# Patient Record
Sex: Female | Born: 1940 | Race: White | Hispanic: No | Smoking: Former smoker
Health system: Southern US, Community
[De-identification: ages and names within clinical notes are randomized; demographics above are authoritative.]

## PROBLEM LIST (undated history)

## (undated) DIAGNOSIS — M1990 Unspecified osteoarthritis, unspecified site: Secondary | ICD-10-CM

## (undated) DIAGNOSIS — H8110 Benign paroxysmal vertigo, unspecified ear: Secondary | ICD-10-CM

## (undated) DIAGNOSIS — E785 Hyperlipidemia, unspecified: Secondary | ICD-10-CM

## (undated) DIAGNOSIS — R002 Palpitations: Secondary | ICD-10-CM

## (undated) DIAGNOSIS — H409 Unspecified glaucoma: Secondary | ICD-10-CM

## (undated) DIAGNOSIS — B019 Varicella without complication: Secondary | ICD-10-CM

## (undated) HISTORY — DX: Varicella without complication: B01.9

## (undated) HISTORY — DX: Palpitations: R00.2

## (undated) HISTORY — PX: JOINT REPLACEMENT: SHX530

## (undated) HISTORY — DX: Unspecified osteoarthritis, unspecified site: M19.90

## (undated) HISTORY — DX: Hyperlipidemia, unspecified: E78.5

## (undated) HISTORY — DX: Unspecified glaucoma: H40.9

## (undated) HISTORY — PX: PLACEMENT OF BREAST IMPLANTS: SHX6334

## (undated) HISTORY — DX: Benign paroxysmal vertigo, unspecified ear: H81.10

## (undated) HISTORY — PX: AUGMENTATION MAMMAPLASTY: SUR837

## (undated) HISTORY — PX: COSMETIC SURGERY: SHX468

## (undated) HISTORY — PX: BREAST IMPLANT REMOVAL: SUR1101

## (undated) HISTORY — PX: CATARACT EXTRACTION: SUR2

---

## 2004-07-26 HISTORY — PX: TOTAL HIP ARTHROPLASTY: SHX124

## 2005-02-23 HISTORY — PX: TOTAL HIP ARTHROPLASTY: SHX124

## 2016-02-15 DIAGNOSIS — H43813 Vitreous degeneration, bilateral: Secondary | ICD-10-CM | POA: Diagnosis not present

## 2016-02-15 DIAGNOSIS — H402232 Chronic angle-closure glaucoma, bilateral, moderate stage: Secondary | ICD-10-CM | POA: Diagnosis not present

## 2016-02-15 DIAGNOSIS — H2513 Age-related nuclear cataract, bilateral: Secondary | ICD-10-CM | POA: Diagnosis not present

## 2016-04-17 DIAGNOSIS — J301 Allergic rhinitis due to pollen: Secondary | ICD-10-CM | POA: Diagnosis not present

## 2016-04-17 DIAGNOSIS — R05 Cough: Secondary | ICD-10-CM | POA: Diagnosis not present

## 2016-05-22 DIAGNOSIS — R0982 Postnasal drip: Secondary | ICD-10-CM | POA: Diagnosis not present

## 2016-06-07 DIAGNOSIS — H402232 Chronic angle-closure glaucoma, bilateral, moderate stage: Secondary | ICD-10-CM | POA: Diagnosis not present

## 2016-06-14 DIAGNOSIS — Z1231 Encounter for screening mammogram for malignant neoplasm of breast: Secondary | ICD-10-CM | POA: Diagnosis not present

## 2016-06-26 DIAGNOSIS — N63 Unspecified lump in breast: Secondary | ICD-10-CM | POA: Diagnosis not present

## 2016-06-26 DIAGNOSIS — R928 Other abnormal and inconclusive findings on diagnostic imaging of breast: Secondary | ICD-10-CM | POA: Diagnosis not present

## 2016-06-26 DIAGNOSIS — R922 Inconclusive mammogram: Secondary | ICD-10-CM | POA: Diagnosis not present

## 2016-07-01 DIAGNOSIS — N959 Unspecified menopausal and perimenopausal disorder: Secondary | ICD-10-CM | POA: Diagnosis not present

## 2016-07-01 DIAGNOSIS — Z0000 Encounter for general adult medical examination without abnormal findings: Secondary | ICD-10-CM | POA: Diagnosis not present

## 2016-07-22 DIAGNOSIS — H2513 Age-related nuclear cataract, bilateral: Secondary | ICD-10-CM | POA: Diagnosis not present

## 2016-07-22 DIAGNOSIS — H402232 Chronic angle-closure glaucoma, bilateral, moderate stage: Secondary | ICD-10-CM | POA: Diagnosis not present

## 2016-08-13 DIAGNOSIS — H269 Unspecified cataract: Secondary | ICD-10-CM | POA: Diagnosis not present

## 2016-08-13 DIAGNOSIS — H2511 Age-related nuclear cataract, right eye: Secondary | ICD-10-CM | POA: Diagnosis not present

## 2016-08-13 DIAGNOSIS — Z87891 Personal history of nicotine dependence: Secondary | ICD-10-CM | POA: Diagnosis not present

## 2016-09-02 DIAGNOSIS — H2512 Age-related nuclear cataract, left eye: Secondary | ICD-10-CM | POA: Diagnosis not present

## 2016-09-10 DIAGNOSIS — H269 Unspecified cataract: Secondary | ICD-10-CM | POA: Diagnosis not present

## 2016-09-10 DIAGNOSIS — H2512 Age-related nuclear cataract, left eye: Secondary | ICD-10-CM | POA: Diagnosis not present

## 2016-09-24 DIAGNOSIS — Z23 Encounter for immunization: Secondary | ICD-10-CM | POA: Diagnosis not present

## 2016-11-04 DIAGNOSIS — H26491 Other secondary cataract, right eye: Secondary | ICD-10-CM | POA: Diagnosis not present

## 2016-11-25 HISTORY — PX: TRIGGER FINGER RELEASE: SHX641

## 2016-12-18 DIAGNOSIS — M65312 Trigger thumb, left thumb: Secondary | ICD-10-CM | POA: Diagnosis not present

## 2016-12-30 DIAGNOSIS — M65312 Trigger thumb, left thumb: Secondary | ICD-10-CM | POA: Diagnosis not present

## 2017-02-12 DIAGNOSIS — H43813 Vitreous degeneration, bilateral: Secondary | ICD-10-CM | POA: Diagnosis not present

## 2017-02-12 DIAGNOSIS — H402232 Chronic angle-closure glaucoma, bilateral, moderate stage: Secondary | ICD-10-CM | POA: Diagnosis not present

## 2017-02-19 DIAGNOSIS — R928 Other abnormal and inconclusive findings on diagnostic imaging of breast: Secondary | ICD-10-CM | POA: Diagnosis not present

## 2017-02-19 DIAGNOSIS — R922 Inconclusive mammogram: Secondary | ICD-10-CM | POA: Diagnosis not present

## 2017-04-30 DIAGNOSIS — H402232 Chronic angle-closure glaucoma, bilateral, moderate stage: Secondary | ICD-10-CM | POA: Diagnosis not present

## 2017-08-11 ENCOUNTER — Encounter: Payer: Self-pay | Admitting: Primary Care

## 2017-08-11 ENCOUNTER — Ambulatory Visit (INDEPENDENT_AMBULATORY_CARE_PROVIDER_SITE_OTHER): Payer: Medicare Other | Admitting: Primary Care

## 2017-08-11 VITALS — BP 124/74 | HR 78 | Temp 98.1°F | Ht 62.0 in | Wt 124.1 lb

## 2017-08-11 DIAGNOSIS — E785 Hyperlipidemia, unspecified: Secondary | ICD-10-CM

## 2017-08-11 DIAGNOSIS — Z23 Encounter for immunization: Secondary | ICD-10-CM | POA: Diagnosis not present

## 2017-08-11 DIAGNOSIS — H409 Unspecified glaucoma: Secondary | ICD-10-CM | POA: Insufficient documentation

## 2017-08-11 DIAGNOSIS — M1990 Unspecified osteoarthritis, unspecified site: Secondary | ICD-10-CM | POA: Insufficient documentation

## 2017-08-11 DIAGNOSIS — M159 Polyosteoarthritis, unspecified: Secondary | ICD-10-CM | POA: Diagnosis not present

## 2017-08-11 NOTE — Assessment & Plan Note (Signed)
Located to hands mostly, also with symptoms of likely carpal tunnel. Will have her continue to wear compression gloves and braces as she's had improvement. Continue to monitor.

## 2017-08-11 NOTE — Assessment & Plan Note (Signed)
Diagnosed years ago, will be seeing new ophthalmologist next week. Continue latanoprost.

## 2017-08-11 NOTE — Progress Notes (Signed)
   Subjective:    Patient ID: Ann Estes, female    DOB: 12/26/1940, 76 y.o.   MRN: [id number]  HPI  [name] is a 76 year old female who presents today to establish care and discuss the problems mentioned below. Will obtain old records. She underwent a CPE in early 2018.  1) Arthritis: Complaints of hand stiffness, intermittent pain, and tingling to fingers that began about 8 months to 12 months ago. She would wake up at night with pain to her palm and forearm, also with symptoms of morning hand stiffness. Over the last several months she's been wearing compression gloves and arm braces at night and has noticed significant improvement.   She does endorse a history of temporary neck pain and left shoulder pain (different times) that resolved after she made adjustments in her ADL's.  History of trigger finger to her left thumb with surgical intervention.  2) Glaucoma: Diagnosed years ago. Currently managed on latanoprost 0.005% eye drops. New patient appointment scheduled for next week.   3) Hyperlipidemia: History of high and low readings in the past. Was recommended to start a statin drug in the past but refused.   Review of Systems  Respiratory: Negative for shortness of breath.   Cardiovascular: Negative for chest pain.  Musculoskeletal: Positive for arthralgias. Negative for neck pain and neck stiffness.  Neurological: Negative for dizziness and headaches.       Past Medical History:  Diagnosis Date  . Glaucoma   . Hyperlipidemia   . Osteoarthritis      Social History   Social History  . Marital status: Divorced    Spouse name: N/A  . Number of children: N/A  . Years of education: N/A   Occupational History  . Not on file.   Social History Main Topics  . Smoking status: Former Research scientist (life sciences)  . Smokeless tobacco: Never Used  . Alcohol use Yes  . Drug use: Unknown  . Sexual activity: Not on file   Other Topics Concern  . Not on file   Social History Narrative  . No  narrative on file    Past Surgical History:  Procedure Laterality Date  . BREAST IMPLANT REMOVAL    . CATARACT EXTRACTION Bilateral   . COSMETIC SURGERY     Eyelids, tummy tuck  . PLACEMENT OF BREAST IMPLANTS    . 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 TOTAL HIP ARTHROPLASTY Left 02/2005  . TRIGGER FINGER RELEASE  11/2016   Thumb    No family history on file.  No Known Allergies  No current outpatient prescriptions on file prior to visit.   No current facility-administered medications on file prior to visit.     BP 124/74   Pulse 78   Temp 98.1 F (36.7 C) (Oral)   Ht 5\' 2"  (1.575 m)   Wt 124 lb 1.9 oz (56.3 kg)   SpO2 97%   BMI 22.70 kg/m    Objective:   Physical Exam  Constitutional: She appears well-nourished.  Neck: Neck supple.  Cardiovascular: Normal rate and regular rhythm.   Pulmonary/Chest: Effort normal and breath sounds normal.  Skin: Skin is warm and dry.  Psychiatric: She has a normal mood and affect.          Assessment & Plan:

## 2017-08-11 NOTE — Assessment & Plan Note (Signed)
Endorses intermittent history of, never managed on statin or any other treatment. Will obtain records for recent lipid panel, repeat in early 2019 during CPE.

## 2017-08-11 NOTE — Patient Instructions (Signed)
You were provided with a flu shot today.  I will obtain your records and notify you when you are due for your annual wellness visit.  Continue to wear the gloves and braces as discussed.  Follow up with the ophthalmologist as scheduled.  It was a pleasure to meet you today! Please don't hesitate to call me with any questions. Welcome to [facility]!   Carpal Tunnel Syndrome Carpal tunnel syndrome is a condition that causes pain in your hand and arm. The carpal tunnel is a narrow area located on the palm side of your wrist. Repeated wrist motion or certain diseases may cause swelling within the tunnel. This swelling pinches the main nerve in the wrist (median nerve). What are the causes? This condition may be caused by:  Repeated wrist motions.  Wrist injuries.  Arthritis.  A cyst or tumor in the carpal tunnel.  Fluid buildup during pregnancy.  Sometimes the cause of this condition is not known. What increases the risk? This condition is more likely to develop in:  People who have jobs that cause them to repeatedly move their wrists in the same motion, such as Art gallery manager.  Women.  People with certain conditions, such as: ? Diabetes. ? Obesity. ? An underactive thyroid (hypothyroidism). ? Kidney failure.  What are the signs or symptoms? Symptoms of this condition include:  A tingling feeling in your fingers, especially in your thumb, index, and middle fingers.  Tingling or numbness in your hand.  An aching feeling in your entire arm, especially when your wrist and elbow are bent for long periods of time.  Wrist pain that goes up your arm to your shoulder.  Pain that goes down into your palm or fingers.  A weak feeling in your hands. You may have trouble grabbing and holding items.  Your symptoms may feel worse during the night. How is this diagnosed? This condition is diagnosed with a medical history and physical exam. You may also have tests,  including:  An electromyogram (EMG). This test measures electrical signals sent by your nerves into the muscles.  X-rays.  How is this treated? Treatment for this condition includes:  Lifestyle changes. It is important to stop doing or modify the activity that caused your condition.  Physical or occupational therapy.  Medicines for pain and inflammation. This may include medicine that is injected into your wrist.  A wrist splint.  Surgery.  Follow these instructions at home: If you have a splint:  Wear it as told by your health care provider. Remove it only as told by your health care provider.  Loosen the splint if your fingers become numb and tingle, or if they turn cold and blue.  Keep the splint clean and dry. General instructions  Take over-the-counter and prescription medicines only as told by your health care provider.  Rest your wrist from any activity that may be causing your pain. If your condition is work related, talk to your employer about changes that can be made, such as getting a wrist pad to use while typing.  If directed, apply ice to the painful area: ? Put ice in a plastic bag. ? Place a towel between your skin and the bag. ? Leave the ice on for 20 minutes, 2-3 times per day.  Keep all follow-up visits as told by your health care provider. This is important.  Do any exercises as told by your health care provider, physical therapist, or occupational therapist. Contact a health care provider if:  You have new symptoms.  Your pain is not controlled with medicines.  Your symptoms get worse. This information is not intended to replace advice given to you by your health care provider. Make sure you discuss any questions you have with your health care provider. Document Released: 11/08/2000 Document Revised: 03/21/2016 Document Reviewed: 03/29/2015 Elsevier Interactive Patient Education  2017 [employer].

## 2017-08-18 DIAGNOSIS — H401130 Primary open-angle glaucoma, bilateral, stage unspecified: Secondary | ICD-10-CM | POA: Diagnosis not present

## 2017-10-20 ENCOUNTER — Ambulatory Visit: Payer: Self-pay | Admitting: *Deleted

## 2017-10-20 NOTE — Telephone Encounter (Signed)
Spoke to patient by telephone and was advised that she has had this problem for years and has gone to her ENT in [place] for treatment for this in the past.   Patient stated that she was thinking about calling the [facility] to see if they can take care of this for her per the conversation with the triage nurse earlier. . Patient stated that if Anda Kraft does not do the procedure she would like her to refer her to an ENT that is more convenient for her. Advised patient that Anda Kraft is out of the office and will not hear back until after she returns tomorrow.

## 2017-10-20 NOTE — Telephone Encounter (Signed)
Please notify patient that I'm not proficient in performing Epley's manuevers but I'm happy to send her to ENT for treatment.   How often does she experience her vertigo symptoms? How often are they bad enough to seek treatment from ENT?

## 2017-10-20 NOTE — Telephone Encounter (Signed)
She has a diagnosis of BPPV and had an episode today. She would like the Epley maneuver performed on her by a physician. Reason for Disposition . [1] MODERATE dizziness (e.g., vertigo; feels very unsteady, interferes with normal activities) AND [2] has been evaluated by physician for this  Answer Assessment - Initial Assessment Questions 1. DESCRIPTION: "Describe your dizziness."     vertigo 2. VERTIGO: "Do you feel like either you or the room is spinning or tilting?"      yes 3. LIGHTHEADED: "Do you feel lightheaded?" (e.g., somewhat faint, woozy, weak upon standing)     yes 4. SEVERITY: "How bad is it?"  "Can you walk?"   - MILD - Feels unsteady but walking normally.   - MODERATE - Feels very unsteady when walking, but not falling; interferes with normal activities (e.g., school, work) .   - SEVERE - Unable to walk without falling (requires assistance).     severe 5. ONSET:  "When did the dizziness begin?"    today 6. AGGRAVATING FACTORS: "Does anything make it worse?" (e.g., standing, change in head position)     no 7. CAUSE: "What do you think is causing the dizziness?"    BPP 8. RECURRENT SYMPTOM: "Have you had dizziness before?" If so, ask: "When was the last time?" "What happened that time?"    Yes, dx of BPPH 9. OTHER SYMPTOMS: "Do you have any other symptoms?" (e.g., headache, weakness, numbness, vomiting, earache)     vomiting 10. PREGNANCY: "Is there any chance you are pregnant?" "When was your last menstrual period?"       no  Protocols used: DIZZINESS - VERTIGO-A-AH

## 2017-10-21 ENCOUNTER — Telehealth: Payer: Self-pay | Admitting: Primary Care

## 2017-10-21 ENCOUNTER — Other Ambulatory Visit: Payer: Self-pay | Admitting: Primary Care

## 2017-10-21 ENCOUNTER — Encounter: Payer: Self-pay | Admitting: Primary Care

## 2017-10-21 DIAGNOSIS — R42 Dizziness and giddiness: Secondary | ICD-10-CM

## 2017-10-21 NOTE — Telephone Encounter (Signed)
Spoken and notified patient of Kate's comments. Patient have actually knows how to do the Epley's maneuvers, she had done it 2 times since yesterday and it has improve.  She had vertigo symptoms once since May of this year but this last episode was really bad for patient. When it is this bad, she usually would seek help from the ENT.  Patient asked for referral to ENT for future treatment. She would like to go to [facility] and to ask for the doctor who is great at [facility] maneuvers.

## 2017-10-21 NOTE — Telephone Encounter (Signed)
Copied from [id number] [id number]. Topic: Quick Communication - Office Called Patient >> Oct 21, 2017  8:46 AM Cecelia Byars, NT wrote: Reason for CRM:  Patient returning call please call will be home all day

## 2017-10-21 NOTE — Telephone Encounter (Signed)
Message left for patient to return call.

## 2017-10-21 NOTE — Telephone Encounter (Signed)
Copied from [place] [id number]. Topic: Quick Communication - Office Called Patient >> Oct 21, 2017  8:39 AM Jacqualin Combes, CMA wrote: Reason for CRM: See Telephone encounter on 10/20/2017. Message left for patient to return call.  please call the pt back with info

## 2017-10-21 NOTE — Telephone Encounter (Signed)
Noted  Referral placed.

## 2017-10-21 NOTE — Telephone Encounter (Signed)
There is a telephone encounter on 10/20/2017 regarding this. This will be close. Please attached to other encounter.

## 2017-11-11 ENCOUNTER — Ambulatory Visit: Payer: No Typology Code available for payment source | Admitting: Internal Medicine

## 2017-11-12 DIAGNOSIS — H6121 Impacted cerumen, right ear: Secondary | ICD-10-CM | POA: Diagnosis not present

## 2017-11-12 DIAGNOSIS — H8111 Benign paroxysmal vertigo, right ear: Secondary | ICD-10-CM | POA: Diagnosis not present

## 2017-11-19 DIAGNOSIS — H8110 Benign paroxysmal vertigo, unspecified ear: Secondary | ICD-10-CM | POA: Diagnosis not present

## 2018-01-12 ENCOUNTER — Ambulatory Visit (INDEPENDENT_AMBULATORY_CARE_PROVIDER_SITE_OTHER): Payer: Medicare Other | Admitting: Primary Care

## 2018-01-12 ENCOUNTER — Encounter: Payer: Self-pay | Admitting: Primary Care

## 2018-01-12 VITALS — BP 120/76 | HR 80 | Temp 98.0°F | Ht 62.0 in | Wt 126.0 lb

## 2018-01-12 DIAGNOSIS — M159 Polyosteoarthritis, unspecified: Secondary | ICD-10-CM | POA: Diagnosis not present

## 2018-01-12 DIAGNOSIS — Z791 Long term (current) use of non-steroidal anti-inflammatories (NSAID): Secondary | ICD-10-CM

## 2018-01-12 DIAGNOSIS — M2560 Stiffness of unspecified joint, not elsewhere classified: Secondary | ICD-10-CM | POA: Diagnosis not present

## 2018-01-12 MED ORDER — MELOXICAM 15 MG PO TABS: ORAL_TABLET | ORAL | 0 refills | Status: DC

## 2018-01-12 NOTE — Patient Instructions (Addendum)
Stop by the lab prior to leaving today. I will notify you of your results once received.   Stop taking Naproxen/Aleve. Start taking meloxicam 15 mg tablets as needed for stiffness and pain. Take 1 tablet once daily.   You will be contacted regarding your referral to physical therapy.  Please let us know if you have not been contacted within one week.   Schedule an appointment with Dr. Lorelei Pont for further evaluation of your trigger finger.   Please schedule a Wellness Visit with our nurse and follow up with me anytime at your convenience. Make sure you come fasting for labs.   It was a pleasure to see you today!

## 2018-01-12 NOTE — Assessment & Plan Note (Signed)
Present to shoulders and hands/fingers. Check for RA given family history. Stop naproxen, start Meloxicam. BMP pending. Referral placed to physical therapy for further treatment. Will have her see sports medicine for further evaluation of trigger finger. Continue regular exercise to maintain mobility.

## 2018-01-12 NOTE — Progress Notes (Signed)
Subjective:    Patient ID: Ann Estes, female    DOB: [birth year], 77 y.o.   MRN: [id number]  HPI  Ann Estes is a 77 year old female with a history of osteoarthritis who presents today with a chief complaint of hand and shoulder stiffness.  She was last evaluated in August 2018 with complaints of hand stiffness and tingling to her fingers that began about one year prior. At that time she was using compression gloves and arm braces with significant improvement.   Since her last visit she continues to wake with stiffness in her fingers with decrease in range of motion. She'll then feel tingling to her fingers as she moves her fingers around. The tingling is intermittent throughout the day. She'll sometimes feel increased symptoms of tingling when on her computer, then sometimes at rest.   She's wearing her braces to bilateral hands most all of the time. She does also experience trigger finger daily to her right middle finger. If she doesn't wear her finger splint then she'll experience pain with contraction, she's been wearing her splint for the past one month. She's never undergone injections for trigger finger.   She does have chronic bilateral shoulder stiffness for which she notices most mornings when waking, improved throughout the day. She has a sister that has rheumatoid arthritis, she's never been tested. She's been taking Aleve 220 mg BID with improvement. She's not undergone physical therapy.  Review of Systems  Musculoskeletal: Positive for arthralgias. Negative for joint swelling.  Skin: Negative for color change.       Past Medical History:  Diagnosis Date  . BPPV (benign paroxysmal positional vertigo)   . Chicken pox   . Glaucoma   . Hyperlipidemia   . Osteoarthritis      Social History   Socioeconomic History  . Marital status: Divorced    Spouse name: Not on file  . Number of children: Not on file  . Years of education: Not on file  . Highest education level: Not  on file  Social Needs  . Financial resource strain: Not on file  . Food insecurity - worry: Not on file  . Food insecurity - inability: Not on file  . Transportation needs - medical: Not on file  . Transportation needs - non-medical: Not on file  Occupational History  . Not on file  Tobacco Use  . Smoking status: Former Research scientist (life sciences)  . Smokeless tobacco: Never Used  Substance and Sexual Activity  . Alcohol use: Yes  . Drug use: Not on file  . Sexual activity: Not on file  Other Topics Concern  . Not on file  Social History Narrative  . Not on file    Family History  Problem Relation Age of Onset  . Heart disease Mother   . Cancer Father   . Birth defects Daughter   . Arthritis Son   . Cancer Maternal Grandmother   . Heart attack Maternal Grandfather   . Asthma Sister   . Depression Sister   . Asthma Sister     No Known Allergies  Current Outpatient Medications on File Prior to Visit  Medication Sig Dispense Refill  . b complex vitamins capsule Take 1 capsule by mouth daily.    . Cholecalciferol (VITAMIN D3) 2000 units TABS Take by mouth.    . DENTAGEL 1.1 % GEL dental gel AFTER BRUSHING WITH TOOTHPAST,BRUSH WITH GEL FOR AT LEAST 1 MINUTE AT BEDTIME  3  . latanoprost (XALATAN) 0.005 % ophthalmic  solution INSTILL 1 DROP INTO BOTH EYES EVERY DAY  2  . Lysine 500 MG CAPS Take 500 mg by mouth daily.    . Magnesium 250 MG TABS Take 250 mg by mouth daily.    . naproxen sodium (ALEVE) 220 MG tablet Take 220 mg by mouth daily as needed.    . Probiotic Product (PROBIOTIC PO) Take by mouth.     No current facility-administered medications on file prior to visit.     BP 120/76   Pulse 80   Temp 98 F (36.7 C) (Oral)   Ht 5\' 2"  (1.575 m)   Wt 126 lb (57.2 kg)   SpO2 98%   BMI 23.05 kg/m    Objective:   Physical Exam  Constitutional: She appears well-nourished.  Neck: Neck supple.  Cardiovascular: Normal rate and regular rhythm.  Pulmonary/Chest: Effort normal and  breath sounds normal.  Musculoskeletal:       Right shoulder: She exhibits normal range of motion.       Left shoulder: She exhibits normal range of motion.  Mild ecrease in ROM to most fingers of bilateral hands. Wrists with normal ROM. Some disfigurement to DIP joints.   Neurological:  Negative Tinel's and Phalen's sign  Skin: Skin is warm and dry.          Assessment & Plan:

## 2018-01-13 LAB — BASIC METABOLIC PANEL
BUN: 21 mg/dL (ref 6–23)
CALCIUM: 10 mg/dL (ref 8.4–10.5)
CO2: 31 mEq/L (ref 19–32)
Chloride: 101 mEq/L (ref 96–112)
Creatinine, Ser: 0.65 mg/dL (ref 0.40–1.20)
GFR: 94.44 mL/min (ref >60.00)
Glucose, Bld: 98 mg/dL (ref 70–99)
Potassium: 4.8 mEq/L (ref 3.5–5.1)
SODIUM: 137 meq/L (ref 135–145)

## 2018-01-13 LAB — SEDIMENTATION RATE: Sed Rate: 7 mm/hr (ref 0–30)

## 2018-01-13 LAB — RHEUMATOID FACTOR

## 2018-01-14 ENCOUNTER — Encounter: Payer: Self-pay | Admitting: Family Medicine

## 2018-01-14 ENCOUNTER — Other Ambulatory Visit: Payer: Self-pay

## 2018-01-14 ENCOUNTER — Ambulatory Visit (INDEPENDENT_AMBULATORY_CARE_PROVIDER_SITE_OTHER): Payer: Medicare Other | Admitting: Family Medicine

## 2018-01-14 VITALS — BP 120/64 | HR 83 | Temp 97.7°F | Ht 62.0 in | Wt 126.0 lb

## 2018-01-14 DIAGNOSIS — M65331 Trigger finger, right middle finger: Secondary | ICD-10-CM

## 2018-01-14 MED ORDER — METHYLPREDNISOLONE ACETATE 40 MG/ML IJ SUSP
20.0000 mg | Freq: Once | INTRAMUSCULAR | Status: AC
  Administered 2018-01-14: 20 mg via INTRA_ARTICULAR

## 2018-01-14 NOTE — Progress Notes (Signed)
   Dr. Frederico Hamman T. Chrystal Zeimet, MD, [facility] [place], [address] Phone: [phone] Fax: [phone]  01/14/2018  Patient: Ann Estes, MRN: [id number], DOB: 11-18-1941, 77 y.o.  Primary Physician:  Pleas Koch, NP   Chief Complaint  Patient presents with  . Trigger Finger    Right Middle   Subjective:   Ann Estes is a 77 y.o. very pleasant female patient who presents with the following:  Classic R middle trigger finger injection.  Trigger Finger Injection, R middle Verbal consent was obtained. Risks (including rare risk of infection, potential risk for skin lightening and potential atrophy), benefits and alternatives were discussed. Prepped with Chloraprep and Ethyl Chloride used for anesthesia. Under sterile conditions, patient injected at palmar crease aiming distally with 45 degree angle towards nodule; injected directly into tendon sheath. Medication flowed freely without resistance.  Needle size: 22 gauge 1 1/2 inch Injection: 1/2 cc of Lidocaine 1% and Depo-Medrol 20 mg   Signed,  Derian Dimalanta T. Marcelene Weidemann, MD

## 2018-01-21 DIAGNOSIS — G5603 Carpal tunnel syndrome, bilateral upper limbs: Secondary | ICD-10-CM | POA: Diagnosis not present

## 2018-01-23 DIAGNOSIS — G5603 Carpal tunnel syndrome, bilateral upper limbs: Secondary | ICD-10-CM | POA: Diagnosis not present

## 2018-01-26 DIAGNOSIS — G5603 Carpal tunnel syndrome, bilateral upper limbs: Secondary | ICD-10-CM | POA: Diagnosis not present

## 2018-01-29 ENCOUNTER — Other Ambulatory Visit: Payer: Self-pay | Admitting: Primary Care

## 2018-01-29 DIAGNOSIS — E785 Hyperlipidemia, unspecified: Secondary | ICD-10-CM

## 2018-01-29 DIAGNOSIS — G5603 Carpal tunnel syndrome, bilateral upper limbs: Secondary | ICD-10-CM | POA: Diagnosis not present

## 2018-02-02 DIAGNOSIS — G5603 Carpal tunnel syndrome, bilateral upper limbs: Secondary | ICD-10-CM | POA: Diagnosis not present

## 2018-02-04 ENCOUNTER — Ambulatory Visit (INDEPENDENT_AMBULATORY_CARE_PROVIDER_SITE_OTHER): Payer: Medicare Other

## 2018-02-04 VITALS — BP 122/70 | HR 59 | Temp 98.5°F | Ht 61.5 in | Wt 124.5 lb

## 2018-02-04 DIAGNOSIS — Z0000 Encounter for general adult medical examination without abnormal findings: Secondary | ICD-10-CM | POA: Diagnosis not present

## 2018-02-04 DIAGNOSIS — E785 Hyperlipidemia, unspecified: Secondary | ICD-10-CM | POA: Diagnosis not present

## 2018-02-04 LAB — LIPID PANEL
CHOLESTEROL: 206 mg/dL — AB (ref 0–200)
HDL: 83.1 mg/dL (ref >39.00)
LDL Cholesterol: 104 mg/dL — ABNORMAL HIGH (ref 0–99)
NonHDL: 123.12
Total CHOL/HDL Ratio: 2
Triglycerides: 96 mg/dL (ref 0.0–149.0)
VLDL: 19.2 mg/dL (ref 0.0–40.0)

## 2018-02-04 LAB — COMPREHENSIVE METABOLIC PANEL
ALBUMIN: 4.4 g/dL (ref 3.5–5.2)
ALT: 15 U/L (ref 0–35)
AST: 18 U/L (ref 0–37)
Alkaline Phosphatase: 77 U/L (ref 39–117)
BUN: 18 mg/dL (ref 6–23)
CHLORIDE: 102 meq/L (ref 96–112)
CO2: 29 meq/L (ref 19–32)
CREATININE: 0.55 mg/dL (ref 0.40–1.20)
Calcium: 10 mg/dL (ref 8.4–10.5)
GFR: 114.51 mL/min (ref >60.00)
GLUCOSE: 93 mg/dL (ref 70–99)
POTASSIUM: 4.6 meq/L (ref 3.5–5.1)
SODIUM: 138 meq/L (ref 135–145)
Total Bilirubin: 0.8 mg/dL (ref 0.2–1.2)
Total Protein: 7.1 g/dL (ref 6.0–8.3)

## 2018-02-04 NOTE — Progress Notes (Signed)
PCP notes:   Health maintenance:  Colonoscopy - addressed Bone density - addressed  Abnormal screenings:   None  Patient concerns:   None  Nurse concerns:  None  Next PCP appt:   02/11/2018 @ 1445

## 2018-02-04 NOTE — Progress Notes (Signed)
Subjective:   Ann Estes is a 77 y.o. female who presents for Medicare Annual (Subsequent) preventive examination.  Review of Systems:  N/A Cardiac Risk Factors include: advanced age (>18men, >92 women);dyslipidemia     Objective:     Vitals: BP 122/70 (BP Location: Right Arm, Patient Position: Sitting, Cuff Size: Normal)   Pulse (!) 59   Temp 98.5 F (36.9 C) (Oral)   Ht 5' 1.5" (1.562 m) Comment: no shoes  Wt 124 lb 8 oz (56.5 kg)   SpO2 94%   BMI 23.14 kg/m   Body mass index is 23.14 kg/m.  Advanced Directives 02/04/2018  Does Patient Have a Medical Advance Directive? Yes  Type of Advance Directive [place] in Chart? No - copy requested    Tobacco Social History   Tobacco Use  Smoking Status Former Smoker  Smokeless Tobacco Never Used     Counseling given: No   Clinical Intake:  Pre-visit preparation completed: Yes  Pain : No/denies pain Pain Score: 0-No pain     Nutritional Status: BMI of 19-24  Normal Nutritional Risks: None Diabetes: No  How often do you need to have someone help you when you read instructions, pamphlets, or other written materials from your doctor or pharmacy?: 1 - Never What is the last grade level you completed in school?: Bachelor degree  Interpreter Needed?: No  Comments: pt lives alone Information entered by :: LPinson, LPN  Past Medical History:  Diagnosis Date  . BPPV (benign paroxysmal positional vertigo)   . Chicken pox   . Glaucoma   . Hyperlipidemia   . Osteoarthritis    Past Surgical History:  Procedure Laterality Date  . BREAST IMPLANT REMOVAL    . CATARACT EXTRACTION Bilateral   . COSMETIC SURGERY     Eyelids, tummy tuck  . PLACEMENT OF BREAST IMPLANTS    . 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 TOTAL HIP ARTHROPLASTY Left 02/2005  . TRIGGER FINGER RELEASE  11/2016   Thumb   Family History  Problem Relation Age of Onset  . Heart disease  Mother   . Cancer Father   . Birth defects Daughter   . Arthritis Son   . Cancer Maternal Grandmother   . Heart attack Maternal Grandfather   . Asthma Sister   . Depression Sister   . Asthma Sister    Social History   Socioeconomic History  . Marital status: Divorced    Spouse name: None  . Number of children: None  . Years of education: None  . Highest education level: None  Social Needs  . Financial resource strain: None  . Food insecurity - worry: None  . Food insecurity - inability: None  . Transportation needs - medical: None  . Transportation needs - non-medical: None  Occupational History  . None  Tobacco Use  . Smoking status: Former Research scientist (life sciences)  . Smokeless tobacco: Never Used  Substance and Sexual Activity  . Alcohol use: Yes  . Drug use: No  . Sexual activity: None  Other Topics Concern  . None  Social History Narrative  . None    Outpatient Encounter Medications as of 02/04/2018  Medication Sig  . b complex vitamins capsule Take 1 capsule by mouth daily.  . Cholecalciferol (VITAMIN D3) 2000 units TABS Take by mouth.  . DENTAGEL 1.1 % GEL dental gel AFTER BRUSHING WITH TOOTHPAST,BRUSH WITH GEL FOR AT LEAST 1 MINUTE AT BEDTIME  .  Green Tea, Camillia sinensis, (GREEN TEA PO) Take by mouth.  . latanoprost (XALATAN) 0.005 % ophthalmic solution INSTILL 1 DROP INTO BOTH EYES EVERY DAY  . Lysine 500 MG CAPS Take 500 mg by mouth daily.  . Magnesium 250 MG TABS Take 250 mg by mouth daily.  . meloxicam (MOBIC) 15 MG tablet Take 1 tablet by mouth once daily as needed for stiffness and pain.  . Multiple Vitamins-Minerals (MULTIVITAMIN WOMEN 50+ PO) Take 1 tablet by mouth daily.  . Probiotic Product (PROBIOTIC PO) Take by mouth.   No facility-administered encounter medications on file as of 02/04/2018.     Activities of Daily Living In your present state of health, do you have any difficulty performing the following activities: 02/04/2018  Hearing? N  Vision? N    Difficulty concentrating or making decisions? N  Walking or climbing stairs? N  Dressing or bathing? N  Doing errands, shopping? N  Preparing Food and eating ? N  Using the Toilet? N  In the past six months, have you accidently leaked urine? N  Do you have problems with loss of bowel control? N  Managing your Medications? N  Managing your Finances? N  Housekeeping or managing your Housekeeping? N    Patient Care Team: Pleas Koch, NP as PCP - General (Internal Medicine) Leandrew Koyanagi, MD as Referring Physician (Ophthalmology)    Assessment:   This is a routine wellness examination for Ann Estes.  Exercise Activities and Dietary recommendations Current Exercise Habits: Home exercise routine, Type of exercise: walking(Walks 3 miles daily), Time (Minutes): 45, Frequency (Times/Week): 7, Weekly Exercise (Minutes/Week): 315, Intensity: Mild, Exercise limited by: None identified  Goals    . Increase physical activity     Starting 02/04/2018, I will continue to walk at least 3 miles daily as weather permits.        Fall Risk Fall Risk  02/04/2018  Falls in the past year? No   Depression Screen PHQ 2/9 Scores 02/04/2018  PHQ - 2 Score 0  PHQ- 9 Score 0     Cognitive Function MMSE - Mini Mental State Exam 02/04/2018  Orientation to time 5  Orientation to Place 5  Registration 3  Attention/ Calculation 0  Recall 3  Language- name 2 objects 0  Language- repeat 1  Language- follow 3 step command 3  Language- read & follow direction 0  Write a sentence 0  Copy design 0  Total score 20     PLEASE NOTE: A Mini-Cog screen was completed. Maximum score is 20. A value of 0 denotes this part of Folstein MMSE was not completed or the patient failed this part of the Mini-Cog screening.   Mini-Cog Screening Orientation to Time - Max 5 pts Orientation to Place - Max 5 pts Registration - Max 3 pts Recall - Max 3 pts Language Repeat - Max 1 pts Language Follow 3 Step  Command - Max 3 pts     Immunization History  Administered Date(s) Administered  . Influenza,inj,Quad PF,6+ Mos 08/11/2017  . Pneumococcal Conjugate-13 11/13/2013  . Pneumococcal Polysaccharide-23 12/22/2014  . Td 07/23/2010    Screening Tests Health Maintenance  Topic Date Due  . COLONOSCOPY  02/05/2019 (Originally 06/12/2014)  . DEXA SCAN  02/23/2019 (Originally 01/14/2008)  . TETANUS/TDAP  07/23/2020  . INFLUENZA VACCINE  Completed  . PNA vac Low Risk Adult  Completed    Plan:     I have personally reviewed, addressed, and noted the following in the patient's chart:  A. Medical and social history B. Use of alcohol, tobacco or illicit drugs  C. Current medications and supplements D. Functional ability and status E.  Nutritional status F.  Physical activity G. Advance directives H. List of other physicians I.  Hospitalizations, surgeries, and ER visits in previous 12 months J.  [place] to include hearing, vision, cognitive, depression L. Referrals and appointments - none  In addition, I have reviewed and discussed with patient certain preventive protocols, quality metrics, and best practice recommendations. A written personalized care plan for preventive services as well as general preventive health recommendations were provided to patient.  See attached scanned questionnaire for additional information.   Signed,   Lindell Noe, MHA, BS, LPN Health Coach

## 2018-02-04 NOTE — Patient Instructions (Signed)
Ann Estes , Thank you for taking time to come for your Medicare Wellness Visit. I appreciate your ongoing commitment to your health goals. Please review the following plan we discussed and let me know if I can assist you in the future.   These are the goals we discussed: Goals    . Increase physical activity     Starting 02/04/2018, I will continue to walk at least 3 miles daily as weather permits.        This is a list of the screening recommended for you and due dates:  Health Maintenance  Topic Date Due  . Colon Cancer Screening  02/05/2019*  . DEXA scan (bone density measurement)  02/23/2019*  . Tetanus Vaccine  07/23/2020  . Flu Shot  Completed  . Pneumonia vaccines  Completed  *Topic was postponed. The date shown is not the original due date.   Preventive Care for Adults  A healthy lifestyle and preventive care can promote health and wellness. Preventive health guidelines for adults include the following key practices.  . A routine yearly physical is a good way to check with your health care provider about your health and preventive screening. It is a chance to share any concerns and updates on your health and to receive a thorough exam.  . Visit your dentist for a routine exam and preventive care every 6 months. Brush your teeth twice a day and floss once a day. Good oral hygiene prevents tooth decay and gum disease.  . The frequency of eye exams is based on your age, health, family medical history, use  of contact lenses, and other factors. Follow your health care provider's recommendations for frequency of eye exams.  . Eat a healthy diet. Foods like vegetables, fruits, whole grains, low-fat dairy products, and lean protein foods contain the nutrients you need without too many calories. Decrease your intake of foods high in solid fats, added sugars, and salt. Eat the right amount of calories for you. Get information about a proper diet from your health care provider, if  necessary.  . Regular physical exercise is one of the most important things you can do for your health. Most adults should get at least 150 minutes of moderate-intensity exercise (any activity that increases your heart rate and causes you to sweat) each week. In addition, most adults need muscle-strengthening exercises on 2 or more days a week.  Silver Sneakers may be a benefit available to you. To determine eligibility, you may visit the website: www.silversneakers.com or contact program at (314)373-3478 Mon-Fri between 8AM-8PM.   . Maintain a healthy weight. The body mass index (BMI) is a screening tool to identify possible weight problems. It provides an estimate of body fat based on height and weight. Your health care provider can find your BMI and can help you achieve or maintain a healthy weight.   For adults 20 years and older: ? A BMI below 18.5 is considered underweight. ? A BMI of 18.5 to 24.9 is normal. ? A BMI of 25 to 29.9 is considered overweight. ? A BMI of 30 and above is considered obese.   . Maintain normal blood lipids and cholesterol levels by exercising and minimizing your intake of saturated fat. Eat a balanced diet with plenty of fruit and vegetables. Blood tests for lipids and cholesterol should begin at age 20 and be repeated every 5 years. If your lipid or cholesterol levels are high, you are over 50, or you are at high risk for  heart disease, you may need your cholesterol levels checked more frequently. Ongoing high lipid and cholesterol levels should be treated with medicines if diet and exercise are not working.  . If you smoke, find out from your health care provider how to quit. If you do not use tobacco, please do not start.  . If you choose to drink alcohol, please do not consume more than 2 drinks per day. One drink is considered to be 12 ounces (355 mL) of beer, 5 ounces (148 mL) of wine, or 1.5 ounces (44 mL) of liquor.  . If you are 47-12 years old, ask your  health care provider if you should take aspirin to prevent strokes.  . Use sunscreen. Apply sunscreen liberally and repeatedly throughout the day. You should seek shade when your shadow is shorter than you. Protect yourself by wearing long sleeves, pants, a wide-brimmed hat, and sunglasses year round, whenever you are outdoors.  . Once a month, do a whole body skin exam, using a mirror to look at the skin on your back. Tell your health care provider of new moles, moles that have irregular borders, moles that are larger than a pencil eraser, or moles that have changed in shape or color.

## 2018-02-05 DIAGNOSIS — G5603 Carpal tunnel syndrome, bilateral upper limbs: Secondary | ICD-10-CM | POA: Diagnosis not present

## 2018-02-06 ENCOUNTER — Ambulatory Visit: Payer: Medicare Other

## 2018-02-08 ENCOUNTER — Other Ambulatory Visit: Payer: Self-pay | Admitting: Primary Care

## 2018-02-08 DIAGNOSIS — M2560 Stiffness of unspecified joint, not elsewhere classified: Secondary | ICD-10-CM

## 2018-02-08 NOTE — Progress Notes (Signed)
I reviewed health advisor's note, was available for consultation, and agree with documentation and plan.  

## 2018-02-09 DIAGNOSIS — G5603 Carpal tunnel syndrome, bilateral upper limbs: Secondary | ICD-10-CM | POA: Diagnosis not present

## 2018-02-09 NOTE — Telephone Encounter (Signed)
Ok to refill? Electronically refill request for meloxicam (MOBIC) 15 MG tablet  Last prescribed on 01/12/2018. Last seen on 01/12/2018. Next follow up on 02/11/2018.

## 2018-02-09 NOTE — Telephone Encounter (Signed)
Any improvement with Meloxicam? Would she like a refill?

## 2018-02-10 NOTE — Telephone Encounter (Signed)
Patient returned call and stated she didn't request refill from pharmacy and would like to discuss further at follow up appointment scheduled for 02/11/18 with PCP.

## 2018-02-10 NOTE — Telephone Encounter (Signed)
Left message for pt to call and let us know how she was doing on the medication and if she needed the refill.

## 2018-02-11 ENCOUNTER — Ambulatory Visit (INDEPENDENT_AMBULATORY_CARE_PROVIDER_SITE_OTHER): Payer: Medicare Other | Admitting: Primary Care

## 2018-02-11 ENCOUNTER — Encounter: Payer: Self-pay | Admitting: Primary Care

## 2018-02-11 ENCOUNTER — Other Ambulatory Visit: Payer: Self-pay | Admitting: Primary Care

## 2018-02-11 VITALS — BP 116/72 | HR 78 | Temp 97.9°F | Ht 61.5 in | Wt 124.5 lb

## 2018-02-11 DIAGNOSIS — M159 Polyosteoarthritis, unspecified: Secondary | ICD-10-CM | POA: Diagnosis not present

## 2018-02-11 DIAGNOSIS — Z23 Encounter for immunization: Secondary | ICD-10-CM | POA: Diagnosis not present

## 2018-02-11 DIAGNOSIS — Z1231 Encounter for screening mammogram for malignant neoplasm of breast: Secondary | ICD-10-CM

## 2018-02-11 DIAGNOSIS — E785 Hyperlipidemia, unspecified: Secondary | ICD-10-CM | POA: Diagnosis not present

## 2018-02-11 DIAGNOSIS — M1990 Unspecified osteoarthritis, unspecified site: Secondary | ICD-10-CM

## 2018-02-11 DIAGNOSIS — Z1239 Encounter for other screening for malignant neoplasm of breast: Secondary | ICD-10-CM

## 2018-02-11 DIAGNOSIS — Z1211 Encounter for screening for malignant neoplasm of colon: Secondary | ICD-10-CM

## 2018-02-11 MED ORDER — ZOSTER VAC RECOMB ADJUVANTED 50 MCG/0.5ML IM SUSR: 0.5000 mL | Freq: Once | INTRAMUSCULAR | 0 refills | Status: DC

## 2018-02-11 MED ORDER — ZOSTER VAC RECOMB ADJUVANTED 50 MCG/0.5ML IM SUSR: 1.0000 mL | Freq: Once | INTRAMUSCULAR | 1 refills | Status: AC

## 2018-02-11 NOTE — Assessment & Plan Note (Signed)
Underwent trigger finger injection in late February 2019. Following with physical therapy. Has had odd dreams on Meloxicam, will change to Diclofenac PRN.   Continue therapy.

## 2018-02-11 NOTE — Assessment & Plan Note (Signed)
Recent lipid panel stable. Continue to monitor. Repeat in 1 year.

## 2018-02-11 NOTE — Patient Instructions (Addendum)
Stop by the lab to pick up your stool cards for testing.  Call the [facility] to schedule your mammogram.  Take the shingles vaccination to your pharmacy for administration.   Restart naproxen over the counter, please update me if your symptoms persist. Continue physical therapy.  Follow up in 1 year for your annual exam or sooner if needed.  It was a pleasure to see you today!

## 2018-02-11 NOTE — Progress Notes (Signed)
Subjective:    Patient ID: Ann Estes, female    DOB: Apr 22, 1941, 77 y.o.   MRN: [id number]  HPI  Ann Estes is a 77 year old female who presents today for [place] Part 2. She saw our health advisor last week.  Immunizations: -Tetanus: Completed in 2011 -Influenza: Completed this season -Pneumonia: Completed Prevnar 13 in 2014, Pneumovax in 2016. -Shingles: Completed Zostavax, interested in Shingrix  Eye exam: Completes regularly Colonoscopy: Completed in 2005, opts for fecal occult stool cards. Mammogram: Completed in 2017, pending as life expectancy is 10 years or greater.    1) Osteoarthritis: Chronic to shoulders, hands/fingers. Recently evaluated for rheumatoid arthritis which was negative. Currently managed on Meloxicam daily. Evaluated by sports medicine in late February 2019 for trigger finger injection to right 3rd metacarpal.   2) Hyperlipidemia: Not currently on medications. Recent lipid panel with TC of 206, LDL of 104, HDL of 83.   Review of Systems  Constitutional: Negative for unexpected weight change.  HENT: Negative for rhinorrhea.   Respiratory: Negative for cough and shortness of breath.   Cardiovascular: Negative for chest pain.  Gastrointestinal: Negative for constipation and diarrhea.  Genitourinary: Negative for difficulty urinating and menstrual problem.  Musculoskeletal: Positive for arthralgias. Negative for myalgias.  Skin: Negative for rash.  Allergic/Immunologic: Negative for environmental allergies.  Neurological: Negative for dizziness, numbness and headaches.       Past Medical History:  Diagnosis Date  . BPPV (benign paroxysmal positional vertigo)   . Chicken pox   . Glaucoma   . Hyperlipidemia   . Osteoarthritis      Social History   Socioeconomic History  . Marital status: Divorced    Spouse name: Not on file  . Number of children: Not on file  . Years of education: Not on file  . Highest education level: Not on file  Social Needs   . Financial resource strain: Not on file  . Food insecurity - worry: Not on file  . Food insecurity - inability: Not on file  . Transportation needs - medical: Not on file  . Transportation needs - non-medical: Not on file  Occupational History  . Not on file  Tobacco Use  . Smoking status: Former Research scientist (life sciences)  . Smokeless tobacco: Never Used  Substance and Sexual Activity  . Alcohol use: Yes  . Drug use: No  . Sexual activity: Not on file  Other Topics Concern  . Not on file  Social History Narrative  . Not on file    Past Surgical History:  Procedure Laterality Date  . BREAST IMPLANT REMOVAL    . CATARACT EXTRACTION Bilateral   . COSMETIC SURGERY     Eyelids, tummy tuck  . PLACEMENT OF BREAST IMPLANTS    . 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 TOTAL HIP ARTHROPLASTY Left 02/2005  . TRIGGER FINGER RELEASE  11/2016   Thumb    Family History  Problem Relation Age of Onset  . Heart disease Mother   . Cancer Father   . Birth defects Daughter   . Arthritis Son   . Cancer Maternal Grandmother   . Heart attack Maternal Grandfather   . Asthma Sister   . Depression Sister   . Asthma Sister     No Known Allergies  Current Outpatient Medications on File Prior to Visit  Medication Sig Dispense Refill  . b complex vitamins capsule Take 1 capsule by mouth daily.    . Cholecalciferol (VITAMIN D3) 2000 units TABS Take  by mouth.    . DENTAGEL 1.1 % GEL dental gel AFTER BRUSHING WITH TOOTHPAST,BRUSH WITH GEL FOR AT LEAST 1 MINUTE AT BEDTIME  3  . Green Tea, Camillia sinensis, (GREEN TEA PO) Take by mouth.    . latanoprost (XALATAN) 0.005 % ophthalmic solution INSTILL 1 DROP INTO BOTH EYES EVERY DAY  2  . Lysine 500 MG CAPS Take 500 mg by mouth daily.    . Magnesium 250 MG TABS Take 250 mg by mouth daily.    . meloxicam (MOBIC) 15 MG tablet Take 1 tablet by mouth once daily as needed for stiffness and pain. 30 tablet 0  . Multiple Vitamins-Minerals (MULTIVITAMIN WOMEN 50+ PO)  Take 1 tablet by mouth daily.    . Probiotic Product (PROBIOTIC PO) Take by mouth.     No current facility-administered medications on file prior to visit.     BP 116/72   Pulse 78   Temp 97.9 F (36.6 C) (Oral)   Ht 5' 1.5" (1.562 m)   Wt 124 lb 8 oz (56.5 kg)   SpO2 98%   BMI 23.14 kg/m    Objective:   Physical Exam  Constitutional: She is oriented to person, place, and time. She appears well-nourished.  HENT:  Right Ear: Tympanic membrane and ear canal normal.  Left Ear: Tympanic membrane and ear canal normal.  Nose: Nose normal.  Mouth/Throat: Oropharynx is clear and moist.  Eyes: Conjunctivae and EOM are normal. Pupils are equal, round, and reactive to light.  Neck: Neck supple. No thyromegaly present.  Cardiovascular: Normal rate and regular rhythm.  No murmur heard. Pulmonary/Chest: Effort normal and breath sounds normal. She has no rales.  Abdominal: Soft. Bowel sounds are normal. There is no tenderness.  Lymphadenopathy:    She has no cervical adenopathy.  Neurological: She is alert and oriented to person, place, and time. She has normal reflexes. No cranial nerve deficit.  Skin: Skin is warm and dry. No rash noted.  Psychiatric: She has a normal mood and affect.          Assessment & Plan:

## 2018-02-11 NOTE — Assessment & Plan Note (Signed)
Following with ophthalmology. 

## 2018-02-12 DIAGNOSIS — G5603 Carpal tunnel syndrome, bilateral upper limbs: Secondary | ICD-10-CM | POA: Diagnosis not present

## 2018-02-13 MED ORDER — DICLOFENAC SODIUM 75 MG PO TBEC: DELAYED_RELEASE_TABLET | ORAL | 0 refills | Status: DC

## 2018-02-16 DIAGNOSIS — G5603 Carpal tunnel syndrome, bilateral upper limbs: Secondary | ICD-10-CM | POA: Diagnosis not present

## 2018-02-17 DIAGNOSIS — H401132 Primary open-angle glaucoma, bilateral, moderate stage: Secondary | ICD-10-CM | POA: Diagnosis not present

## 2018-02-17 DIAGNOSIS — H401130 Primary open-angle glaucoma, bilateral, stage unspecified: Secondary | ICD-10-CM | POA: Diagnosis not present

## 2018-02-19 DIAGNOSIS — G5603 Carpal tunnel syndrome, bilateral upper limbs: Secondary | ICD-10-CM | POA: Diagnosis not present

## 2018-02-20 DIAGNOSIS — H401131 Primary open-angle glaucoma, bilateral, mild stage: Secondary | ICD-10-CM | POA: Diagnosis not present

## 2018-02-23 ENCOUNTER — Encounter: Payer: Self-pay | Admitting: Primary Care

## 2018-02-23 DIAGNOSIS — M25532 Pain in left wrist: Secondary | ICD-10-CM

## 2018-02-23 DIAGNOSIS — R202 Paresthesia of skin: Secondary | ICD-10-CM

## 2018-02-23 DIAGNOSIS — M25531 Pain in right wrist: Secondary | ICD-10-CM

## 2018-02-23 DIAGNOSIS — M1990 Unspecified osteoarthritis, unspecified site: Secondary | ICD-10-CM

## 2018-02-23 DIAGNOSIS — R200 Anesthesia of skin: Secondary | ICD-10-CM

## 2018-02-24 MED ORDER — MELOXICAM 7.5 MG PO TABS: 7.5000 mg | ORAL_TABLET | Freq: Two times a day (BID) | ORAL | 0 refills | Status: DC | PRN

## 2018-02-26 ENCOUNTER — Other Ambulatory Visit (INDEPENDENT_AMBULATORY_CARE_PROVIDER_SITE_OTHER): Payer: Medicare Other

## 2018-02-26 DIAGNOSIS — Z1211 Encounter for screening for malignant neoplasm of colon: Secondary | ICD-10-CM | POA: Diagnosis not present

## 2018-02-26 LAB — FECAL OCCULT BLOOD, IMMUNOCHEMICAL: FECAL OCCULT BLD: NEGATIVE

## 2018-03-23 ENCOUNTER — Other Ambulatory Visit: Payer: Self-pay | Admitting: Primary Care

## 2018-03-23 DIAGNOSIS — M1990 Unspecified osteoarthritis, unspecified site: Secondary | ICD-10-CM

## 2018-03-23 MED ORDER — MELOXICAM 7.5 MG PO TABS: 7.5000 mg | ORAL_TABLET | Freq: Two times a day (BID) | ORAL | 0 refills | Status: DC | PRN

## 2018-03-23 NOTE — Telephone Encounter (Signed)
Ok to refill? MyChart refill request for meloxicam (MOBIC) 7.5 MG tablet  Last prescribed on 02/24/2018. Last seen on 02/11/2018

## 2018-03-23 NOTE — Telephone Encounter (Signed)
Refill sent to pharmacy.   

## 2018-04-10 DIAGNOSIS — E538 Deficiency of other specified B group vitamins: Secondary | ICD-10-CM | POA: Diagnosis not present

## 2018-04-10 DIAGNOSIS — R202 Paresthesia of skin: Secondary | ICD-10-CM | POA: Diagnosis not present

## 2018-04-10 DIAGNOSIS — E559 Vitamin D deficiency, unspecified: Secondary | ICD-10-CM | POA: Diagnosis not present

## 2018-04-10 DIAGNOSIS — R200 Anesthesia of skin: Secondary | ICD-10-CM | POA: Diagnosis not present

## 2018-05-11 DIAGNOSIS — R202 Paresthesia of skin: Secondary | ICD-10-CM | POA: Diagnosis not present

## 2018-05-11 DIAGNOSIS — R200 Anesthesia of skin: Secondary | ICD-10-CM | POA: Diagnosis not present

## 2018-05-18 DIAGNOSIS — R200 Anesthesia of skin: Secondary | ICD-10-CM | POA: Diagnosis not present

## 2018-05-18 DIAGNOSIS — R202 Paresthesia of skin: Secondary | ICD-10-CM | POA: Diagnosis not present

## 2018-05-26 DIAGNOSIS — G5602 Carpal tunnel syndrome, left upper limb: Secondary | ICD-10-CM | POA: Diagnosis not present

## 2018-06-16 ENCOUNTER — Other Ambulatory Visit: Payer: Self-pay | Admitting: Primary Care

## 2018-06-16 DIAGNOSIS — M1990 Unspecified osteoarthritis, unspecified site: Secondary | ICD-10-CM

## 2018-08-17 ENCOUNTER — Ambulatory Visit
Admission: RE | Admit: 2018-08-17 | Discharge: 2018-08-17 | Disposition: A | Discharge status: Home / Self Care | Payer: Medicare Other | Source: Ambulatory Visit | Attending: Primary Care | Admitting: Primary Care

## 2018-08-17 DIAGNOSIS — Z1231 Encounter for screening mammogram for malignant neoplasm of breast: Secondary | ICD-10-CM | POA: Insufficient documentation

## 2018-08-17 DIAGNOSIS — Z1239 Encounter for other screening for malignant neoplasm of breast: Secondary | ICD-10-CM

## 2018-08-21 DIAGNOSIS — H401131 Primary open-angle glaucoma, bilateral, mild stage: Secondary | ICD-10-CM | POA: Diagnosis not present

## 2018-08-31 ENCOUNTER — Inpatient Hospital Stay
Admission: RE | Admit: 2018-08-31 | Discharge: 2018-08-31 | Disposition: A | Discharge status: Home / Self Care | Payer: Self-pay | Source: Ambulatory Visit | Attending: Primary Care | Admitting: Primary Care

## 2018-08-31 ENCOUNTER — Other Ambulatory Visit: Payer: Self-pay | Admitting: Primary Care

## 2018-08-31 DIAGNOSIS — Z1239 Encounter for other screening for malignant neoplasm of breast: Secondary | ICD-10-CM

## 2018-09-29 ENCOUNTER — Other Ambulatory Visit: Payer: Self-pay | Admitting: Primary Care

## 2018-09-29 DIAGNOSIS — M1990 Unspecified osteoarthritis, unspecified site: Secondary | ICD-10-CM

## 2018-10-05 DIAGNOSIS — Z23 Encounter for immunization: Secondary | ICD-10-CM | POA: Diagnosis not present

## 2018-10-12 ENCOUNTER — Telehealth: Payer: Self-pay | Admitting: *Deleted

## 2018-10-12 NOTE — Telephone Encounter (Signed)
Noted and agree. Will evaluate patient tomorrow as scheduled.

## 2018-10-12 NOTE — Telephone Encounter (Signed)
Patient called stating that she has some concerns.  Patient stated that she feels that she has had some palpitations the last couple of days when she is sitting around. Patient stated that she may be having some anxiety because of an upcoming trip that she is making next week. Patient stated that she is not having any chest pain, no SOB, left arm pain or any other symptoms. Patient stated that she would like to come in to be checked out before she goes out of town next week. Appointment scheduled for tomorrow Tuesday November 19 with Allie Bossier NP.Marland Kitchen Advised patient that if she has any chest pain, SOB or any other symptoms before her appointment to go to the ER or call 911 and she agreed.

## 2018-10-13 ENCOUNTER — Ambulatory Visit (INDEPENDENT_AMBULATORY_CARE_PROVIDER_SITE_OTHER): Payer: Medicare Other | Admitting: Primary Care

## 2018-10-13 ENCOUNTER — Encounter: Payer: Self-pay | Admitting: Primary Care

## 2018-10-13 VITALS — BP 122/74 | HR 80 | Temp 98.2°F | Ht 61.5 in | Wt 125.2 lb

## 2018-10-13 DIAGNOSIS — R002 Palpitations: Secondary | ICD-10-CM

## 2018-10-13 HISTORY — DX: Palpitations: R00.2

## 2018-10-13 NOTE — Patient Instructions (Signed)
Stop by the lab prior to leaving today. I will notify you of your results once received.   It was a pleasure to see you today!  

## 2018-10-13 NOTE — Progress Notes (Signed)
Subjective:    Patient ID: Ann Estes, female    DOB: 1941/11/04, 77 y.o.   MRN: [id number]  HPI  Ann Estes is a 77 year old female with a history of hyperlipidemia who presents today with a chief complaint of palpitations.  Her palpitations are located to the lower substernal chest and epigastric area. She describes her symptoms as a "heavy heartbeat" that is not fast. Her symptoms initially began around three weeks ago and occurs when resting (both sitting and laying down). Her symptoms will last several minutes and will occur intermittently everyday.   She endorses feeling anxious with the unknown cause of her wrist symptoms, personal stress with family, and is stressed regarding the government. She denies chest pain, shortness of breath, symptoms with exertion, epigastric pain, esophageal reflux, diarrhea, nausea, radiation of pain.   Over the last 2 days her symptoms have been improved as she knew she was coming to our office for evaluation.   Review of Systems  Constitutional: Negative for fatigue and fever.  Respiratory: Negative for shortness of breath.   Cardiovascular: Positive for palpitations. Negative for chest pain.  Gastrointestinal: Negative for abdominal pain, constipation, diarrhea, nausea and vomiting.       Past Medical History:  Diagnosis Date  . BPPV (benign paroxysmal positional vertigo)   . Chicken pox   . Glaucoma   . Hyperlipidemia   . Osteoarthritis      Social History   Socioeconomic History  . Marital status: Divorced    Spouse name: Not on file  . Number of children: Not on file  . Years of education: Not on file  . Highest education level: Not on file  Occupational History  . Not on file  Social Needs  . Financial resource strain: Not on file  . Food insecurity:    Worry: Not on file    Inability: Not on file  . Transportation needs:    Medical: Not on file    Non-medical: Not on file  Tobacco Use  . Smoking status: Former Research scientist (life sciences)  .  Smokeless tobacco: Never Used  Substance and Sexual Activity  . Alcohol use: Yes  . Drug use: No  . Sexual activity: Not on file  Lifestyle  . Physical activity:    Days per week: Not on file    Minutes per session: Not on file  . Stress: Not on file  Relationships  . Social connections:    Talks on phone: Not on file    Gets together: Not on file    Attends religious service: Not on file    Active member of club or organization: Not on file    Attends meetings of clubs or organizations: Not on file    Relationship status: Not on file  . Intimate partner violence:    Fear of current or ex partner: Not on file    Emotionally abused: Not on file    Physically abused: Not on file    Forced sexual activity: Not on file  Other Topics Concern  . Not on file  Social History Narrative  . Not on file    Past Surgical History:  Procedure Laterality Date  . AUGMENTATION MAMMAPLASTY Bilateral ?   Impants in and then removed   . BREAST IMPLANT REMOVAL    . CATARACT EXTRACTION Bilateral   . COSMETIC SURGERY     Eyelids, tummy tuck  . PLACEMENT OF BREAST IMPLANTS    . 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  TOTAL HIP ARTHROPLASTY Left 02/2005  . TRIGGER FINGER RELEASE  11/2016   Thumb    Family History  Problem Relation Age of Onset  . Heart disease Mother   . Cancer Father   . Birth defects Daughter   . Arthritis Son   . Cancer Maternal Grandmother   . Heart attack Maternal Grandfather   . Asthma Sister   . Depression Sister   . Asthma Sister     No Known Allergies  Current Outpatient Medications on File Prior to Visit  Medication Sig Dispense Refill  . b complex vitamins capsule Take 1 capsule by mouth daily.    . Cholecalciferol (VITAMIN D3) 2000 units TABS Take by mouth.    . DENTAGEL 1.1 % GEL dental gel AFTER BRUSHING WITH TOOTHPAST,BRUSH WITH GEL FOR AT LEAST 1 MINUTE AT BEDTIME  3  . Green Tea, Camillia sinensis, (GREEN TEA PO) Take by mouth.    . latanoprost  (XALATAN) 0.005 % ophthalmic solution INSTILL 1 DROP INTO BOTH EYES EVERY DAY  2  . Lysine 500 MG CAPS Take 500 mg by mouth daily.    . Magnesium 250 MG TABS Take 250 mg by mouth daily.    . meloxicam (MOBIC) 7.5 MG tablet TAKE 1 TABLET BY MOUTH 2 TIMES DAILY AS NEEDED FOR PAIN.DAYS SUPPLY QTY AS PER INSURANCE 180 tablet 0  . Multiple Vitamins-Minerals (MULTIVITAMIN WOMEN 50+ PO) Take 1 tablet by mouth daily.    . Probiotic Product (PROBIOTIC PO) Take by mouth.     No current facility-administered medications on file prior to visit.     BP 122/74   Pulse 80   Temp 98.2 F (36.8 C) (Oral)   Ht 5' 1.5" (1.562 m)   Wt 125 lb 4 oz (56.8 kg)   SpO2 98%   BMI 23.28 kg/m    Objective:   Physical Exam  Constitutional: She appears well-nourished.  Neck: Neck supple.  Cardiovascular: Normal rate and regular rhythm.  Respiratory: Effort normal and breath sounds normal.  GI: Soft. Bowel sounds are normal. There is no tenderness.  Skin: Skin is warm and dry.  Psychiatric: She has a normal mood and affect.           Assessment & Plan:

## 2018-10-13 NOTE — Assessment & Plan Note (Signed)
Exam today unremarkable.  ECG with NSR, rate of 79. No ST elevation, PAC/PVC, or T-wave inversion except for V1 and AVR. No prior ECG to compare. Check labs today including TSH, CBC, BMP.   Given lack of cardiac symptoms coupled with ECG results, suspect symptoms are secondary to anxiety. She notices the symptoms when she's resting and not when she's active/busy. Await labs.

## 2018-10-14 LAB — CBC
HCT: 41.8 % (ref 36.0–46.0)
Hemoglobin: 13.9 g/dL (ref 12.0–15.0)
MCHC: 33.3 g/dL (ref 30.0–36.0)
MCV: 91.2 fl (ref 78.0–100.0)
Platelets: 279 10*3/uL (ref 150.0–400.0)
RBC: 4.58 Mil/uL (ref 3.87–5.11)
RDW: 13.2 % (ref 11.5–15.5)
WBC: 8.5 10*3/uL (ref 4.0–10.5)

## 2018-10-14 LAB — BASIC METABOLIC PANEL
BUN: 23 mg/dL (ref 6–23)
CO2: 29 mEq/L (ref 19–32)
CREATININE: 0.69 mg/dL (ref 0.40–1.20)
Calcium: 10.2 mg/dL (ref 8.4–10.5)
Chloride: 102 mEq/L (ref 96–112)
GFR: 87.98 mL/min (ref >60.00)
Glucose, Bld: 89 mg/dL (ref 70–99)
Potassium: 4.5 mEq/L (ref 3.5–5.1)
Sodium: 141 mEq/L (ref 135–145)

## 2018-10-14 LAB — TSH: TSH: 1.17 u[IU]/mL (ref 0.35–4.50)

## 2018-10-26 DIAGNOSIS — G5603 Carpal tunnel syndrome, bilateral upper limbs: Secondary | ICD-10-CM | POA: Diagnosis not present

## 2018-11-12 DIAGNOSIS — Z96643 Presence of artificial hip joint, bilateral: Secondary | ICD-10-CM | POA: Diagnosis not present

## 2018-11-16 DIAGNOSIS — H5789 Other specified disorders of eye and adnexa: Secondary | ICD-10-CM | POA: Diagnosis not present

## 2018-12-02 ENCOUNTER — Encounter: Payer: Self-pay | Admitting: Family Medicine

## 2018-12-02 NOTE — Telephone Encounter (Signed)
Yes, can you help her with an appointment with me

## 2018-12-06 NOTE — Progress Notes (Signed)
   Dr. Frederico Hamman T. Ayansh Feutz, MD, [facility] [place], [address] Phone: [phone] Fax: [phone]  12/09/2018  Patient: Ann Estes, MRN: [id number], DOB: 1941-03-17, 78 y.o.  Primary Physician:  Pleas Koch, NP   Chief Complaint  Patient presents with  . Trigger Finger    Right Middle     I did a trigger finger injection 12/2017 on her R 3rd digit, and she has had some return of sx and wants a repeat injection.  Trigger Finger Injection, R 3rd Date of procedure: 12/09/2018 Verbal consent was obtained. Risks (including rare risk of infection, potential risk for skin lightening and potential atrophy), benefits and alternatives were discussed. Prepped with Chloraprep and Ethyl Chloride used for anesthesia. Under sterile conditions, patient injected at palmar crease aiming distally with 45 degree angle towards nodule; injected directly into tendon sheath. Medication flowed freely without resistance.  Needle size: 22 gauge 1 1/2 inch Injection: 1/2 cc of Lidocaine 1% and Depo-Medrol 20 mg Medication: Depo-Medrol 20 mg   Signed,  Sagan Wurzel T. Jessel Gettinger, MD

## 2018-12-07 ENCOUNTER — Other Ambulatory Visit: Payer: Self-pay | Admitting: *Deleted

## 2018-12-09 ENCOUNTER — Ambulatory Visit (INDEPENDENT_AMBULATORY_CARE_PROVIDER_SITE_OTHER): Payer: Medicare Other | Admitting: Family Medicine

## 2018-12-09 ENCOUNTER — Encounter: Payer: Self-pay | Admitting: Family Medicine

## 2018-12-09 VITALS — BP 120/78 | HR 86 | Temp 98.3°F | Ht 61.5 in | Wt 126.8 lb

## 2018-12-09 DIAGNOSIS — M65331 Trigger finger, right middle finger: Secondary | ICD-10-CM

## 2018-12-09 MED ORDER — METHYLPREDNISOLONE ACETATE 40 MG/ML IJ SUSP
20.0000 mg | Freq: Once | INTRAMUSCULAR | Status: AC
  Administered 2018-12-09: 20 mg via INTRA_ARTICULAR

## 2019-01-01 ENCOUNTER — Other Ambulatory Visit: Payer: Self-pay

## 2019-01-01 DIAGNOSIS — M1990 Unspecified osteoarthritis, unspecified site: Secondary | ICD-10-CM

## 2019-01-01 NOTE — Telephone Encounter (Signed)
Refill request Meloxicam Last office visit 12/09/18 Dr. Lorelei Pont Last refill 09/29/18 #[phone]

## 2019-01-01 NOTE — Telephone Encounter (Signed)
See my chart message

## 2019-01-04 MED ORDER — MELOXICAM 7.5 MG PO TABS: 7.5000 mg | ORAL_TABLET | Freq: Two times a day (BID) | ORAL | 0 refills | Status: DC | PRN

## 2019-01-29 ENCOUNTER — Encounter: Payer: Self-pay | Admitting: Primary Care

## 2019-01-29 ENCOUNTER — Ambulatory Visit (INDEPENDENT_AMBULATORY_CARE_PROVIDER_SITE_OTHER): Payer: Medicare Other | Admitting: Primary Care

## 2019-01-29 VITALS — BP 120/72 | HR 87 | Temp 98.0°F | Ht 61.5 in | Wt 125.5 lb

## 2019-01-29 DIAGNOSIS — R002 Palpitations: Secondary | ICD-10-CM

## 2019-01-29 NOTE — Patient Instructions (Signed)
You will be contacted regarding your referral to cardiology.  Please let us know if you have not been contacted within one week.   Stop magnesium tablets.  Consider stopping Meloxicam and trying one naproxen (Aleve) with one acetaminophen (Tylenol) as needed for pain.  It was a pleasure to see you today!   Premature Ventricular Contraction  A premature ventricular contraction (PVC) is a common kind of irregular heartbeat (arrhythmia). These contractions are extra heartbeats that start in the ventricles of the heart and occur too early in the normal sequence. During the PVC, the heart's normal electrical pathway is not used, so the beat is shorter and less effective. In most cases, these contractions come and go and do not require treatment. What are the causes? Common causes of the condition include:  Smoking.  Drinking alcohol.  Certain medicines.  Some illegal drugs.  Stress.  Caffeine. Certain medical conditions can also cause PVCs:  Heart failure.  Heart attack, or coronary artery disease.  Heart valve problems.  Changes in minerals in the blood (electrolytes).  Low blood oxygen levels or high carbon dioxide levels. In many cases, the cause of this condition is not known. What are the signs or symptoms? The main symptom of this condition is fast or skipped heartbeats (palpitations). Other symptoms include:  Chest pain.  Shortness of breath.  Feeling tired.  Dizziness.  Difficulty exercising. In some cases, there are no symptoms. How is this diagnosed? This condition may be diagnosed based on:  Your medical history.  A physical exam. During the exam, the health care provider will check for irregular heartbeats.  Tests, such as: ? An ECG (electrocardiogram) to monitor the electrical activity of your heart. ? Ambulatory cardiac monitor. This device records your heartbeats for 24 hours or more. ? Stress tests to see how exercise affects your heart rhythm  and blood supply. ? Echocardiogram. This test uses sound waves (ultrasound) to produce an image of your heart. ? Electrophysiology study (EPS). This test checks for electrical problems in your heart. How is this treated? Treatment for this condition depends on any underlying conditions, the type of PVCs that you are having, and how much the symptoms are interfering with your daily life. Possible treatments include:  Avoiding things that cause premature contractions (triggers). These include caffeine and alcohol.  Taking medicines if symptoms are severe or if the extra heartbeats are frequent.  Getting treatment for underlying conditions that cause PVCs.  Having an implantable cardioverter defibrillator (ICD), if you are at risk for a serious arrhythmia. The ICD is a small device that is inserted into your chest to monitor your heartbeat. When it senses an irregular heartbeat, it sends a shock to bring the heartbeat back to normal.  Having a procedure to destroy the portion of the heart tissue that sends out abnormal signals (catheter ablation). In some cases, no treatment is required. Follow these instructions at home: Alcohol use   Do not drink alcohol if: ? Your health care provider tells you not to drink. ? You are pregnant, may be pregnant, or are planning to become pregnant. ? Alcohol triggers your episodes.  If you drink alcohol, limit how much you have. You may drink: ? 0-1 drink a day for women. ? 0-2 drinks a day for men.  Be aware of how much alcohol is in your drink. In the [place] one drink equals one typical bottle of beer (12 oz), one-half glass of wine (5 oz), or one shot of hard liquor (  1 oz). General instructions  Do not use any products that contain nicotine or tobacco, such as cigarettes and e-cigarettes. If you need help quitting, ask your health care provider.  Find healthy ways to manage stress. Avoid stressful situations when possible.  Exercise regularly.  Ask your health care provider what type of exercise is safe for you.  Try to get at least 7-9 hours of sleep each night, or as much as recommended by your health care provider.  If caffeine triggers episodes of PVC, do not eat, drink, or use anything with caffeine in it.  Do not use illegal drugs.  Take over-the-counter and prescription medicines only as told by your health care provider.  Keep all follow-up visits as told by your health care provider. This is important. Contact a health care provider if you:  Feel palpitations. Get help right away if you:  Have chest pain.  Have shortness of breath.  Have sweating for no reason.  Have nausea and vomiting.  Become light-headed or you faint. Summary  A premature ventricular contraction (PVC) is a common kind of irregular heartbeat (arrhythmia).  In most cases, these contractions come and go and do not require treatment.  You may need to wear an ambulatory cardiac monitor. This records your heartbeats for 24 hours or more.  Treatment depends on any underlying conditions, the type of PVCs that you are having, and how much the symptoms are interfering with your daily life. This information is not intended to replace advice given to you by your health care provider. Make sure you discuss any questions you have with your health care provider. Document Released: 06/28/2004 Document Revised: 06/26/2018 Document Reviewed: 12/30/2017 Elsevier Interactive Patient Education  2019 [employer].

## 2019-01-29 NOTE — Assessment & Plan Note (Addendum)
Continued and endorses chronic for at least one year.  Work up during last visit unremarkable.  Doesn't appear to be overly anxious.   Repeat ECG today with PVC's. Also with BBB? No acute ischemia.   Given continued symptoms with temporary abnormal heart sounds without obvious etiology will send to cardiology for further evaluation. May need Holter Monitor. Referral placed.   Discussed to stop magnesium and limit caffeine.

## 2019-01-29 NOTE — Progress Notes (Signed)
Subjective:    Patient ID: Ann Estes, female    DOB: 10-10-1941, 78 y.o.   MRN: [id number]  HPI  Ann Estes is a 78 year old female with a history of palpitations and hyperlipidemia who presents today with a chief complaint of irregular heartbeat.  She was last evaluated on 10/13/18 for complaints of substernal chest and epigastric palpitations that she described as a "heavy heartbeat". Symptoms were present at rest lasting several minutes. She endorsed anxiety and stress with personal health and family. Work up that day including ECG and labs were unremarkable.  Since her last visit she's continued to experience the "heavy heart beat" intermittently. She thought these symptoms were secondary to her Meloxicam so she stopped taking. She's been taking Tylenol instead and didn't notice resolve in her symptoms. She was at her family member's house who was checking her BP manually and noticed a "pause" in her heart beats. She's experimented with medication by taking Meloxicam at various times of the day, taking naproxen instead of Meloxicam. She thinks that naproxen may have not has caused the "heavy heart beat" symptoms.   She will notice her symptoms at rest, after eating. She never notices her symptoms with exertion. She walks often and is hardly sedentary. She denies chest pain, dizziness, shortness of breath, increased stress/inability to handle stress, belching/esophageal burning/epigastric pain. She does take daily magnesium tablets for "digestive health", also three cups of green tea in the morning.   BP Readings from Last 3 Encounters:  01/29/19 120/72  12/09/18 120/78  10/13/18 122/74     Review of Systems  Constitutional: Negative for fatigue.  Eyes: Negative for visual disturbance.  Respiratory: Negative for shortness of breath.   Cardiovascular: Negative for chest pain and palpitations.       Heavy heart beat feeling  Neurological: Negative for headaches.       Past Medical  History:  Diagnosis Date  . BPPV (benign paroxysmal positional vertigo)   . Chicken pox   . Glaucoma   . Hyperlipidemia   . Osteoarthritis      Social History   Socioeconomic History  . Marital status: Divorced    Spouse name: Not on file  . Number of children: Not on file  . Years of education: Not on file  . Highest education level: Not on file  Occupational History  . Not on file  Social Needs  . Financial resource strain: Not on file  . Food insecurity:    Worry: Not on file    Inability: Not on file  . Transportation needs:    Medical: Not on file    Non-medical: Not on file  Tobacco Use  . Smoking status: Former Research scientist (life sciences)  . Smokeless tobacco: Never Used  Substance and Sexual Activity  . Alcohol use: Yes  . Drug use: No  . Sexual activity: Not on file  Lifestyle  . Physical activity:    Days per week: Not on file    Minutes per session: Not on file  . Stress: Not on file  Relationships  . Social connections:    Talks on phone: Not on file    Gets together: Not on file    Attends religious service: Not on file    Active member of club or organization: Not on file    Attends meetings of clubs or organizations: Not on file    Relationship status: Not on file  . Intimate partner violence:    Fear of current or ex partner: Not  on file    Emotionally abused: Not on file    Physically abused: Not on file    Forced sexual activity: Not on file  Other Topics Concern  . Not on file  Social History Narrative  . Not on file    Past Surgical History:  Procedure Laterality Date  . AUGMENTATION MAMMAPLASTY Bilateral ?   Impants in and then removed   . BREAST IMPLANT REMOVAL    . CATARACT EXTRACTION Bilateral   . COSMETIC SURGERY     Eyelids, tummy tuck  . PLACEMENT OF BREAST IMPLANTS    . 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 TOTAL HIP ARTHROPLASTY Left 02/2005  . TRIGGER FINGER RELEASE  11/2016   Thumb    Family History  Problem Relation Age of Onset    . Heart disease Mother   . Cancer Father   . Birth defects Daughter   . Arthritis Son   . Cancer Maternal Grandmother   . Heart attack Maternal Grandfather   . Asthma Sister   . Depression Sister   . Asthma Sister     No Known Allergies  Current Outpatient Medications on File Prior to Visit  Medication Sig Dispense Refill  . b complex vitamins capsule Take 1 capsule by mouth daily.    . Cholecalciferol (VITAMIN D3) 2000 units TABS Take by mouth.    [place] Cowden Tea, Camillia sinensis, (GREEN TEA PO) Take by mouth.    . latanoprost (XALATAN) 0.005 % ophthalmic solution INSTILL 1 DROP INTO BOTH EYES EVERY DAY  2  . Lysine 500 MG CAPS Take 500 mg by mouth daily.    . Magnesium 250 MG TABS Take 250 mg by mouth daily.    . Multiple Vitamins-Minerals (MULTIVITAMIN WOMEN 50+ PO) Take 1 tablet by mouth daily.    . meloxicam (MOBIC) 7.5 MG tablet Take 1 tablet (7.5 mg total) by mouth 2 (two) times daily as needed for pain. (Patient not taking: Reported on 01/29/2019) 180 tablet 0   No current facility-administered medications on file prior to visit.     BP 120/72   Pulse 87   Temp 98 F (36.7 C) (Oral)   Ht 5' 1.5" (1.562 m)   Wt 125 lb 8 oz (56.9 kg)   SpO2 98%   BMI 23.33 kg/m    Objective:   Physical Exam  Constitutional: She appears well-nourished.  Neck: Neck supple.  Cardiovascular: Normal rate, regular rhythm and normal heart sounds.  No murmur heard. Respiratory: Effort normal and breath sounds normal.  Skin: Skin is warm and dry.  Psychiatric: She has a normal mood and affect.           Assessment & Plan:

## 2019-02-08 ENCOUNTER — Other Ambulatory Visit (INDEPENDENT_AMBULATORY_CARE_PROVIDER_SITE_OTHER): Payer: Medicare Other

## 2019-02-08 ENCOUNTER — Ambulatory Visit: Payer: Medicare Other

## 2019-02-08 ENCOUNTER — Other Ambulatory Visit: Payer: Self-pay | Admitting: Primary Care

## 2019-02-08 ENCOUNTER — Other Ambulatory Visit: Payer: Self-pay

## 2019-02-08 DIAGNOSIS — E785 Hyperlipidemia, unspecified: Secondary | ICD-10-CM | POA: Diagnosis not present

## 2019-02-08 LAB — COMPREHENSIVE METABOLIC PANEL
ALBUMIN: 4.4 g/dL (ref 3.5–5.2)
ALK PHOS: 60 U/L (ref 39–117)
ALT: 14 U/L (ref 0–35)
AST: 19 U/L (ref 0–37)
BILIRUBIN TOTAL: 1 mg/dL (ref 0.2–1.2)
BUN: 21 mg/dL (ref 6–23)
CALCIUM: 9.6 mg/dL (ref 8.4–10.5)
CO2: 28 mEq/L (ref 19–32)
CREATININE: 0.7 mg/dL (ref 0.40–1.20)
Chloride: 102 mEq/L (ref 96–112)
GFR: 81.34 mL/min (ref >60.00)
Glucose, Bld: 119 mg/dL — ABNORMAL HIGH (ref 70–99)
Potassium: 4.5 mEq/L (ref 3.5–5.1)
Sodium: 138 mEq/L (ref 135–145)
TOTAL PROTEIN: 6.8 g/dL (ref 6.0–8.3)

## 2019-02-08 LAB — LIPID PANEL
CHOLESTEROL: 195 mg/dL (ref 0–200)
HDL: 71.8 mg/dL (ref >39.00)
LDL Cholesterol: 97 mg/dL (ref 0–99)
NonHDL: 123.28
TRIGLYCERIDES: 133 mg/dL (ref 0.0–149.0)
Total CHOL/HDL Ratio: 3
VLDL: 26.6 mg/dL (ref 0.0–40.0)

## 2019-02-15 ENCOUNTER — Ambulatory Visit (INDEPENDENT_AMBULATORY_CARE_PROVIDER_SITE_OTHER): Payer: Medicare Other | Admitting: Primary Care

## 2019-02-15 ENCOUNTER — Other Ambulatory Visit: Payer: Self-pay

## 2019-02-15 DIAGNOSIS — M159 Polyosteoarthritis, unspecified: Secondary | ICD-10-CM | POA: Diagnosis not present

## 2019-02-15 DIAGNOSIS — E2839 Other primary ovarian failure: Secondary | ICD-10-CM

## 2019-02-15 DIAGNOSIS — R002 Palpitations: Secondary | ICD-10-CM

## 2019-02-15 DIAGNOSIS — E785 Hyperlipidemia, unspecified: Secondary | ICD-10-CM | POA: Diagnosis not present

## 2019-02-15 DIAGNOSIS — H409 Unspecified glaucoma: Secondary | ICD-10-CM

## 2019-02-15 DIAGNOSIS — Z0000 Encounter for general adult medical examination without abnormal findings: Secondary | ICD-10-CM

## 2019-02-15 NOTE — Assessment & Plan Note (Signed)
Following with ophthalmology often, no changes in treatment.

## 2019-02-15 NOTE — Assessment & Plan Note (Signed)
Resumed Meloxicam several days ago, doing well with arthritis overall. Discussed to remain active and stretch daily.

## 2019-02-15 NOTE — Assessment & Plan Note (Signed)
No real improvement after holding NSAID's and magnesium. Has an appointment with cardiology in May 2020.

## 2019-02-15 NOTE — Assessment & Plan Note (Signed)
Immunizations UTD. Bone density due, she will get later this year. Mammogram UTD, prefers every other year for mammogram.  Declines colonoscopy due to age. She will return for fecal occult card for annual screening. Recommended regular exercise, healthy diet.   I have personally reviewed and have noted: 1. The patient's medical and social history 2. Their use of alcohol, tobacco or illicit drugs 3. Their current medications and supplements 4. The patient's functional ability including  ADL's, fall risks, home safety risks and  hearing or visual impairment. 5. Diet and physical activities 6. Evidence for depression or mood disorder

## 2019-02-15 NOTE — Assessment & Plan Note (Signed)
Stable on recent labs. Continue to monitor.  

## 2019-02-15 NOTE — Progress Notes (Signed)
Patient ID: Ann Estes, female   DOB: Dec 11, 1940, 78 y.o.   MRN: [id number]  HPI: Ann Estes is a 78 year old female who presents today via phone for [place].   Past Medical History:  Diagnosis Date  . BPPV (benign paroxysmal positional vertigo)   . Chicken pox   . Glaucoma   . Hyperlipidemia   . Osteoarthritis     Current Outpatient Medications  Medication Sig Dispense Refill  . b complex vitamins capsule Take 1 capsule by mouth daily.    . Cholecalciferol (VITAMIN D3) 2000 units TABS Take by mouth.    Nyoka Cowden Tea, Camillia sinensis, (GREEN TEA PO) Take by mouth.    . latanoprost (XALATAN) 0.005 % ophthalmic solution INSTILL 1 DROP INTO BOTH EYES EVERY DAY  2  . Lysine 500 MG CAPS Take 500 mg by mouth daily.    . Magnesium 250 MG TABS Take 250 mg by mouth daily.    . meloxicam (MOBIC) 7.5 MG tablet Take 1 tablet (7.5 mg total) by mouth 2 (two) times daily as needed for pain. (Patient not taking: Reported on 01/29/2019) 180 tablet 0  . Multiple Vitamins-Minerals (MULTIVITAMIN WOMEN 50+ PO) Take 1 tablet by mouth daily.     No current facility-administered medications for this visit.     No Known Allergies  Family History  Problem Relation Age of Onset  . Heart disease Mother   . Cancer Father   . Birth defects Daughter   . Arthritis Son   . Cancer Maternal Grandmother   . Heart attack Maternal Grandfather   . Asthma Sister   . Depression Sister   . Asthma Sister     Social History   Socioeconomic History  . Marital status: Divorced    Spouse name: Not on file  . Number of children: Not on file  . Years of education: Not on file  . Highest education level: Not on file  Occupational History  . Not on file  Social Needs  . Financial resource strain: Not on file  . Food insecurity:    Worry: Not on file    Inability: Not on file  . Transportation needs:    Medical: Not on file    Non-medical: Not on file  Tobacco Use  . Smoking status: Former Research scientist (life sciences)  . Smokeless  tobacco: Never Used  Substance and Sexual Activity  . Alcohol use: Yes  . Drug use: No  . Sexual activity: Not on file  Lifestyle  . Physical activity:    Days per week: Not on file    Minutes per session: Not on file  . Stress: Not on file  Relationships  . Social connections:    Talks on phone: Not on file    Gets together: Not on file    Attends religious service: Not on file    Active member of club or organization: Not on file    Attends meetings of clubs or organizations: Not on file    Relationship status: Not on file  . Intimate partner violence:    Fear of current or ex partner: Not on file    Emotionally abused: Not on file    Physically abused: Not on file    Forced sexual activity: Not on file  Other Topics Concern  . Not on file  Social History Narrative  . Not on file    Hospitiliaztions: None  Health Maintenance:    Flu: Completed this season  Tetanus: Completed in 2011  Pneumovax: Completed in 2016  Prevnar: Completed in 2014  Zostavax: Completed in 2010.  Shingrix: Completed in 2019 for both doses  Bone Density: No recent evaluation   Colonoscopy: Completed last in 02/17/04, typically completes fecal  occult cards.   Eye Doctor: Completed 08/21/18  Dental Exam: Completed in 2019  Mammogram: Completed in October 2019, opts for every other year     Providers: Alma Friendly, PCP   I have personally reviewed and have noted: 1. The patient's medical and social history 2. Their use of alcohol, tobacco or illicit drugs 3. Their current medications and supplements 4. The patient's functional ability including ADL's, fall risks, home safety  risks and hearing or visual impairment. 5. Diet and physical activities 6. Evidence for depression or mood disorder  Subjective:   Review of Systems:   Constitutional: Denies fever, malaise, fatigue, headache or abrupt weight changes.  HEENT: Denies eye pain, eye redness, ear pain, ringing in the ears, wax  buildup, runny nose, nasal congestion, bloody nose, or sore throat. Respiratory: Denies difficulty breathing, shortness of breath, cough or sputum production.   Cardiovascular: Denies chest pain, chest tightness, palpitations or swelling in the hands or feet.  Gastrointestinal: Denies abdominal pain, bloating, constipation, diarrhea or blood in the stool.  GU: Denies urgency, frequency, pain with urination, burning sensation, blood in urine, odor or discharge. Musculoskeletal: Denies decrease in range of motion, difficulty with gait, muscle pain or joint pain and swelling.  Skin: Denies redness, rashes, lesions or ulcercations.  Neurological: Denies dizziness, difficulty with memory, difficulty with speech or problems with balance and coordination.  Psychiatric: Denies concerns for anxiety or depression.   No other specific complaints in a complete review of systems (except as listed in HPI above).  Objective:   There were no vitals taken for this visit. Wt Readings from Last 3 Encounters:  01/29/19 125 lb 8 oz (56.9 kg)  12/09/18 126 lb 12 oz (57.5 kg)  10/13/18 125 lb 4 oz (56.8 kg)    Pulmonary/Chest: Speaking in complete sentences via phone. Neurological: Alert and oriented.  Psychiatric: Mood and affect normal. Behavior is normal. Judgment and thought content normal.     BMET    Component Value Date/Time   NA 138 02/08/2019 1107   K 4.5 02/08/2019 1107   CL 102 02/08/2019 1107   CO2 28 02/08/2019 1107   GLUCOSE 119 (H) 02/08/2019 1107   BUN 21 02/08/2019 1107   CREATININE 0.70 02/08/2019 1107   CALCIUM 9.6 02/08/2019 1107    Lipid Panel     Component Value Date/Time   CHOL 195 02/08/2019 1107   TRIG 133.0 02/08/2019 1107   HDL 71.80 02/08/2019 1107   CHOLHDL 3 02/08/2019 1107   VLDL 26.6 02/08/2019 1107   LDLCALC 97 02/08/2019 1107    CBC    Component Value Date/Time   WBC 8.5 10/13/2018 1505   RBC 4.58 10/13/2018 1505   HGB 13.9 10/13/2018 1505   HCT  41.8 10/13/2018 1505   PLT 279.0 10/13/2018 1505   MCV 91.2 10/13/2018 1505   MCHC 33.3 10/13/2018 1505   RDW 13.2 10/13/2018 1505    Hgb A1C No results found for: HGBA1C    Assessment and Plan:   Medicare Annual Wellness Visit:   Physical activity: Active Depression/mood screen: Negative Hearing: Intact to whispered voice Visual acuity: Grossly normal, performs annual eye exam  ADLs: Capable Fall risk: None Home safety: Good Cognitive evaluation: Intact to orientation, naming, recall and repetition EOL planning:  Adv directives  Preventative Medicine: Immunizations UTD. Bone density due, she will get later this year. Mammogram UTD, prefers every other year for mammogram.  Declines colonoscopy due to age. She will return for fecal occult card for annual screening. Recommended regular exercise, healthy diet.  All recommendations provided at end of visit.  Next appointment: One year.

## 2019-03-24 ENCOUNTER — Telehealth: Payer: Self-pay | Admitting: Interventional Cardiology

## 2019-03-24 NOTE — Telephone Encounter (Signed)
Lm to call back ./cy 

## 2019-03-24 NOTE — Telephone Encounter (Signed)
New Message    Pt is calling back and is not sure if she needs to be seen in the office or do the virtual visit    Please call back

## 2019-03-24 NOTE — Telephone Encounter (Signed)
Follow up  ° ° °Patient is returning your call. °

## 2019-03-24 NOTE — Telephone Encounter (Signed)
Pt aware will be notified in a few days of appt type and info will be acquired at that time ./cy

## 2019-03-25 ENCOUNTER — Telehealth: Payer: Self-pay | Admitting: Interventional Cardiology

## 2019-03-25 NOTE — Telephone Encounter (Signed)
Spoke with pt in regards to upcoming appt with Dr. Tamala Julian.  She is agreeable to change over to a phone visit, unable to do virtual.  Advised pt CMA would call just prior to appt and go over meds.  Has no way of checking vitals.  Went over consent.  Pt agreeable.      Virtual Visit Pre-Appointment Phone Call  "(Name), I am calling you today to discuss your upcoming appointment. We are currently trying to limit exposure to the virus that causes COVID-19 by seeing patients at home rather than in the office."  1. "What is the BEST phone number to call the day of the visit?" - include this in appointment notes  2. "Do you have or have access to (through a family member/friend) a smartphone with video capability that we can use for your visit?" a. If yes - list this number in appt notes as "cell" (if different from BEST phone #) and list the appointment type as a VIDEO visit in appointment notes b. If no - list the appointment type as a PHONE visit in appointment notes  3. Confirm consent - "In the setting of the current Covid19 crisis, you are scheduled for a (phone or video) visit with your provider on (date) at (time).  Just as we do with many in-office visits, in order for you to participate in this visit, we must obtain consent.  If you'd like, I can send this to your mychart (if signed up) or email for you to review.  Otherwise, I can obtain your verbal consent now.  All virtual visits are billed to your insurance company just like a normal visit would be.  By agreeing to a virtual visit, we'd like you to understand that the technology does not allow for your provider to perform an examination, and thus may limit your provider's ability to fully assess your condition. If your provider identifies any concerns that need to be evaluated in person, we will make arrangements to do so.  Finally, though the technology is pretty good, we cannot assure that it will always work on either your or our end, and in  the setting of a video visit, we may have to convert it to a phone-only visit.  In either situation, we cannot ensure that we have a secure connection.  Are you willing to proceed?" STAFF: Did the patient verbally acknowledge consent to telehealth visit? Document YES/NO here: yes  4. Advise patient to be prepared - "Two hours prior to your appointment, go ahead and check your blood pressure, pulse, oxygen saturation, and your weight (if you have the equipment to check those) and write them all down. When your visit starts, your provider will ask you for this information. If you have an Apple Watch or Kardia device, please plan to have heart rate information ready on the day of your appointment. Please have a pen and paper handy nearby the day of the visit as well."  5. Give patient instructions for MyChart download to smartphone OR Doximity/Doxy.me as below if video visit (depending on what platform provider is using)  6. Inform patient they will receive a phone call 15 minutes prior to their appointment time (may be from unknown caller ID) so they should be prepared to answer    TELEPHONE CALL NOTE  Ann Estes has been deemed a candidate for a follow-up tele-health visit to limit community exposure during the Covid-19 pandemic. I spoke with the patient via phone to ensure availability of  phone/video source, confirm preferred email & phone number, and discuss instructions and expectations.  I reminded Ann Estes to be prepared with any vital sign and/or heart rhythm information that could potentially be obtained via home monitoring, at the time of her visit. I reminded Ann Estes to expect a phone call prior to her visit.  Bowers,Jennifer L, LPN 5/80/9983 3:82 PM   INSTRUCTIONS FOR DOWNLOADING THE MYCHART APP TO SMARTPHONE  - The patient must first make sure to have activated MyChart and know their login information - If Apple, go to [employer] and type in MyChart in the search bar and  download the app. If Android, ask patient to go to [employer] and type in [place] in the search bar and download the app. The app is free but as with any other app downloads, their phone may require them to verify saved payment information or Apple/Android password.  - The patient will need to then log into the app with their MyChart username and password, and select [facility] as their healthcare provider to link the account. When it is time for your visit, go to the MyChart app, find appointments, and click Begin Video Visit. Be sure to Select Allow for your device to access the Microphone and Camera for your visit. You will then be connected, and your provider will be with you shortly.  **If they have any issues connecting, or need assistance please contact MyChart service desk [phone])**  **If using a computer, in order to ensure the best quality for their visit they will need to use either of the following Internet Browsers: [employer], or Google Chrome**  IF USING DOXIMITY or DOXY.ME - The patient will receive a link just prior to their visit by text.     FULL LENGTH CONSENT FOR TELE-HEALTH VISIT   I hereby voluntarily request, consent and authorize [facility] and its employed or contracted physicians, physician assistants, nurse practitioners or other licensed health care professionals (the Practitioner), to provide me with telemedicine health care services (the "Services") as deemed necessary by the treating Practitioner. I acknowledge and consent to receive the Services by the Practitioner via telemedicine. I understand that the telemedicine visit will involve communicating with the Practitioner through live audiovisual communication technology and the disclosure of certain medical information by electronic transmission. I acknowledge that I have been given the opportunity to request an in-person assessment or other available alternative prior to the  telemedicine visit and am voluntarily participating in the telemedicine visit.  I understand that I have the right to withhold or withdraw my consent to the use of telemedicine in the course of my care at any time, without affecting my right to future care or treatment, and that the Practitioner or I may terminate the telemedicine visit at any time. I understand that I have the right to inspect all information obtained and/or recorded in the course of the telemedicine visit and may receive copies of available information for a reasonable fee.  I understand that some of the potential risks of receiving the Services via telemedicine include:  Marland Kitchen Delay or interruption in medical evaluation due to technological equipment failure or disruption; . Information transmitted may not be sufficient (e.g. poor resolution of images) to allow for appropriate medical decision making by the Practitioner; and/or  . In rare instances, security protocols could fail, causing a breach of personal health information.  Furthermore, I acknowledge that it is my responsibility to provide information about my medical history,  conditions and care that is complete and accurate to the best of my ability. I acknowledge that Practitioner's advice, recommendations, and/or decision may be based on factors not within their control, such as incomplete or inaccurate data provided by me or distortions of diagnostic images or specimens that may result from electronic transmissions. I understand that the practice of medicine is not an exact science and that Practitioner makes no warranties or guarantees regarding treatment outcomes. I acknowledge that I will receive a copy of this consent concurrently upon execution via email to the email address I last provided but may also request a printed copy by calling the office of [facility].    I understand that my insurance will be billed for this visit.   I have read or had this consent read to me.  . I understand the contents of this consent, which adequately explains the benefits and risks of the Services being provided via telemedicine.  . I have been provided ample opportunity to ask questions regarding this consent and the Services and have had my questions answered to my satisfaction. . I give my informed consent for the services to be provided through the use of telemedicine in my medical care  By participating in this telemedicine visit I agree to the above.

## 2019-03-30 NOTE — Progress Notes (Signed)
Virtual Visit via Video Note   This visit type was conducted due to national recommendations for restrictions regarding the COVID-19 Pandemic (e.g. social distancing) in an effort to limit this patient's exposure and mitigate transmission in our community.  Due to her co-morbid illnesses, this patient is at least at moderate risk for complications without adequate follow up.  This format is felt to be most appropriate for this patient at this time.  All issues noted in this document were discussed and addressed.  A limited physical exam was performed with this format.  Please refer to the patient's chart for her consent to telehealth for [facility].   Date:  03/31/2019   ID:  Ann Estes, DOB 24-Apr-1941, MRN [id number]  Patient Location: Home Provider Location: Office  PCP:  Pleas Koch, NP  Cardiologist:  No primary care provider on file.  Electrophysiologist:  None   Evaluation Performed:  New Patient Evaluation  Chief Complaint:  Plapitations  History of Present Illness:    Ann Estes is a 78 y.o. female with is referred for evaluation of palpitations. Referred for consultation by K. Carlis Abbott, NP.  The patient has a 1 year history of a "jiggling" feeling in her chest.  Her daughter-in-law who is a nurse measured her blood pressure and noted irregularity in the heart rhythm.  She complained to the primary physician about this in November and again in March.  The November EKG demonstrated QS pattern in V1 and V2.  The more recent EKG demonstrated QS pattern in V1 and V2 with isolated PVCs also identified.  The patient does recall a remote history where she was told "you have had a previous heart attack".  She does have dyspnea on exertion.  She does not have lower extremity swelling, orthopnea, or PND.  She is a remote smoker.  She does shop for herself, she walks her dog, can climb a flight of stairs.  There is a family history of heart arrhythmia with one sister known to have  PVCs, her mother had early coronary disease and myocardial infarction, 1 sister had an ischemic cardiac event that was not due to blocked arteries (question SCAD) and has also a history of ventricular tachycardia.  The patient does not have symptoms concerning for COVID-19 infection (fever, chills, cough, or new shortness of breath).    Past Medical History:  Diagnosis Date  . BPPV (benign paroxysmal positional vertigo)   . Chicken pox   . Glaucoma   . Hyperlipidemia   . Osteoarthritis   . Palpitations 10/13/2018   Past Surgical History:  Procedure Laterality Date  . AUGMENTATION MAMMAPLASTY Bilateral ?   Impants in and then removed   . BREAST IMPLANT REMOVAL    . CATARACT EXTRACTION Bilateral   . COSMETIC SURGERY     Eyelids, tummy tuck  . PLACEMENT OF BREAST IMPLANTS    . 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 TOTAL HIP ARTHROPLASTY Left 02/2005  . TRIGGER FINGER RELEASE  11/2016   Thumb     Current Meds  Medication Sig  . Cholecalciferol (VITAMIN D3) 2000 units TABS Take by mouth.  Nyoka Cowden Tea, Camillia sinensis, (GREEN TEA PO) Take by mouth.  Marland Kitchen KRILL OIL PO Take 1 capsule by mouth every other day.  . latanoprost (XALATAN) 0.005 % ophthalmic solution INSTILL 1 DROP INTO BOTH EYES EVERY DAY  . meloxicam (MOBIC) 7.5 MG tablet Take 7.5 mg by mouth daily.     Allergies:   Patient has no  known allergies.   Social History   Tobacco Use  . Smoking status: Former Research scientist (life sciences)  . Smokeless tobacco: Never Used  Substance Use Topics  . Alcohol use: Yes  . Drug use: No     Family Hx: The patient's family history includes Arthritis in her son; Asthma in her sister and sister; Birth defects in her daughter; Cancer in her father and maternal grandmother; Depression in her sister; Heart attack in her maternal grandfather; Heart disease in her mother.  ROS:   Please see the history of present illness.    Since her daughter-in-law told her of the irregular heartbeat she has been  nervous.  She sleeps well.  She denies snoring. All other systems reviewed and are negative.   Prior CV studies:   The following studies were reviewed today:  The electrocardiogram performed in March reveals QS pattern in V1 and V2, isolated PVC appearing to arise from the left ventricle.  Otherwise no significant abnormality.  Labs/Other Tests and Data Reviewed:    EKG:  See description of EKGs done recently noted above.  Recent Labs: 10/13/2018: Hemoglobin 13.9; Platelets 279.0; TSH 1.17 02/08/2019: ALT 14; BUN 21; Creatinine, Ser 0.70; Potassium 4.5; Sodium 138   Recent Lipid Panel Lab Results  Component Value Date/Time   CHOL 195 02/08/2019 11:07 AM   TRIG 133.0 02/08/2019 11:07 AM   HDL 71.80 02/08/2019 11:07 AM   CHOLHDL 3 02/08/2019 11:07 AM   LDLCALC 97 02/08/2019 11:07 AM    Wt Readings from Last 3 Encounters:  03/31/19 123 lb (55.8 kg)  01/29/19 125 lb 8 oz (56.9 kg)  12/09/18 126 lb 12 oz (57.5 kg)     Objective:    Vital Signs:  Ht 5' 1.5" (1.562 m)   Wt 123 lb (55.8 kg)   BMI 22.86 kg/m    VITAL SIGNS:  reviewed  ASSESSMENT & PLAN:    1. PVC (premature ventricular contraction)   2. ECG abnormality   3. Hyperlipidemia, unspecified hyperlipidemia type   4. DOE (dyspnea on exertion)   5. 2019 novel coronavirus disease (COVID-19)    PLAN:  1. 7-day Holter monitor to quantitate PVC burden.  The study will also be done to exclude other arrhythmia such as atrial fibrillation. 2. ECG suggested possibility of prior anteroseptal infarct.  Suspiciously, it sounds like she has known of an abnormal EKG for greater than 20 years.  In that case this could simply represent her baseline EKG. 3. Lipids are moderately elevated.She is on no therapy.  HDL is relatively high. 4. Uncertain if dyspnea on exertion is functional related to deconditioning or if there could potentially be either systolic or diastolic dysfunction.  COVID-19 Education: The signs and symptoms  of COVID-19 were discussed with the patient and how to seek care for testing (follow up with PCP or arrange E-visit).  The importance of social distancing was discussed today.  Time:   Today, I have spent 20 minutes with the patient with telehealth technology discussing the above problems.     Medication Adjustments/Labs and Tests Ordered: Current medicines are reviewed at length with the patient today.  Concerns regarding medicines are outlined above.   Tests Ordered: No orders of the defined types were placed in this encounter.   Medication Changes: No orders of the defined types were placed in this encounter.   Disposition:  Follow up in 1 month(s)  Signed, Sinclair Grooms, MD  03/31/2019 2:27 PM    [facility]  

## 2019-03-31 ENCOUNTER — Telehealth: Payer: Self-pay | Admitting: *Deleted

## 2019-03-31 ENCOUNTER — Other Ambulatory Visit: Payer: Self-pay

## 2019-03-31 ENCOUNTER — Telehealth (INDEPENDENT_AMBULATORY_CARE_PROVIDER_SITE_OTHER): Payer: Medicare Other | Admitting: Interventional Cardiology

## 2019-03-31 ENCOUNTER — Encounter: Payer: Self-pay | Admitting: Interventional Cardiology

## 2019-03-31 VITALS — Ht 61.5 in | Wt 123.0 lb

## 2019-03-31 DIAGNOSIS — R9431 Abnormal electrocardiogram [ECG] [EKG]: Secondary | ICD-10-CM

## 2019-03-31 DIAGNOSIS — I493 Ventricular premature depolarization: Secondary | ICD-10-CM | POA: Diagnosis not present

## 2019-03-31 DIAGNOSIS — U071 COVID-19: Secondary | ICD-10-CM

## 2019-03-31 DIAGNOSIS — R0609 Other forms of dyspnea: Secondary | ICD-10-CM

## 2019-03-31 DIAGNOSIS — E785 Hyperlipidemia, unspecified: Secondary | ICD-10-CM

## 2019-03-31 NOTE — Addendum Note (Signed)
Addended by: Loren Racer on: 03/31/2019 04:51 PM   Modules accepted: Orders

## 2019-03-31 NOTE — Telephone Encounter (Signed)
Patient enrolled for [employer] to mail a 7 day ZIO AT long term Live telemetry monitor to her home.  Please review instructions in kit and call [employer] to send your baseline recording, ( after applying monitor patch).  Mail back to [facility] in prepaid packaging asap after 7 days of wear, as we will not receive your final report until after the monitor has been returned.

## 2019-03-31 NOTE — Patient Instructions (Signed)
Medication Instructions:  Your physician recommends that you continue on your current medications as directed. Please refer to the Current Medication list given to you today.  If you need a refill on your cardiac medications before your next appointment, please call your pharmacy.   Lab work: None If you have labs (blood work) drawn today and your tests are completely normal, you will receive your results only by: Marland Kitchen MyChart Message (if you have MyChart) OR . A paper copy in the mail If you have any lab test that is abnormal or we need to change your treatment, we will call you to review the results.  Testing/Procedures: Your physician has requested that you have an echocardiogram. Echocardiography is a painless test that uses sound waves to create images of your heart. It provides your doctor with information about the size and shape of your heart and how well your heart's chambers and valves are working. This procedure takes approximately one hour. There are no restrictions for this procedure.  Your physician has recommended that you wear a holter monitor. Holter monitors are medical devices that record the heart's electrical activity. Doctors most often use these monitors to diagnose arrhythmias. Arrhythmias are problems with the speed or rhythm of the heartbeat. The monitor is a small, portable device. You can wear one while you do your normal daily activities. This is usually used to diagnose what is causing palpitations/syncope (passing out).   Follow-Up: Your physician recommends that you schedule a follow-up appointment in: 1 month with Dr. Tamala Julian.   Any Other Special Instructions Will Be Listed Below (If Applicable).

## 2019-03-31 NOTE — Addendum Note (Signed)
Addended by: Loren Racer on: 03/31/2019 05:14 PM   Modules accepted: Orders

## 2019-04-08 ENCOUNTER — Ambulatory Visit (INDEPENDENT_AMBULATORY_CARE_PROVIDER_SITE_OTHER): Payer: Medicare Other

## 2019-04-08 ENCOUNTER — Telehealth: Payer: Self-pay

## 2019-04-08 DIAGNOSIS — I493 Ventricular premature depolarization: Secondary | ICD-10-CM | POA: Diagnosis not present

## 2019-04-08 DIAGNOSIS — R9431 Abnormal electrocardiogram [ECG] [EKG]: Secondary | ICD-10-CM | POA: Diagnosis not present

## 2019-04-08 NOTE — Telephone Encounter (Signed)
Pt wanted to know if she was to wear her holter monitor for how many days. I told according to [facility] notes she is to wear the monitor for 7 days then to send it back. I asked her do she need Shelly to call back and she said only if she has to wear the monitor for more than 7 days or less than 7 days.

## 2019-04-21 ENCOUNTER — Other Ambulatory Visit: Payer: Self-pay

## 2019-04-26 ENCOUNTER — Telehealth (HOSPITAL_COMMUNITY): Payer: Self-pay

## 2019-04-26 ENCOUNTER — Telehealth: Payer: Self-pay | Admitting: *Deleted

## 2019-04-26 DIAGNOSIS — I493 Ventricular premature depolarization: Secondary | ICD-10-CM

## 2019-04-26 MED ORDER — METOPROLOL SUCCINATE ER 25 MG PO TB24: 25.0000 mg | ORAL_TABLET | Freq: Every day | ORAL | 3 refills | Status: DC

## 2019-04-26 NOTE — Telephone Encounter (Signed)

## 2019-04-26 NOTE — Telephone Encounter (Signed)
-----   Message from Belva Crome, MD sent at 04/23/2019  5:08 PM EDT ----- Let the patient know she has frequent premature ventricular contractions. She needs a 2 D doppler Echo if no recent study in past 2 months. She needs Potassium and Magnesium. Needs f/u visit. Metoprolol succinate 25 mg daily. A copy will be sent to Pleas Koch, NP

## 2019-04-26 NOTE — Telephone Encounter (Signed)
Spoke with pt and went over results and recommendations.  Pt scheduled for echo tomorrow.  Advised we will draw labs as well.  Pt has virtual visit on 6/8.  Pt verbalized understanding and was appreciative for call.

## 2019-04-27 ENCOUNTER — Other Ambulatory Visit: Payer: Self-pay

## 2019-04-27 ENCOUNTER — Other Ambulatory Visit (HOSPITAL_COMMUNITY): Payer: Medicare Other

## 2019-04-27 ENCOUNTER — Other Ambulatory Visit: Payer: Medicare Other

## 2019-04-27 ENCOUNTER — Ambulatory Visit (HOSPITAL_COMMUNITY): Payer: Medicare Other | Attending: Interventional Cardiology

## 2019-04-27 DIAGNOSIS — I493 Ventricular premature depolarization: Secondary | ICD-10-CM

## 2019-04-27 DIAGNOSIS — R0609 Other forms of dyspnea: Secondary | ICD-10-CM

## 2019-04-28 ENCOUNTER — Telehealth: Payer: Self-pay

## 2019-04-28 LAB — BASIC METABOLIC PANEL
BUN/Creatinine Ratio: 29 — ABNORMAL HIGH (ref 12–28)
BUN: 18 mg/dL (ref 8–27)
CO2: 26 mmol/L (ref 20–29)
Calcium: 10 mg/dL (ref 8.7–10.3)
Chloride: 99 mmol/L (ref 96–106)
Creatinine, Ser: 0.63 mg/dL (ref 0.57–1.00)
GFR calc Af Amer: 101 mL/min/{1.73_m2} (ref >59)
GFR calc non Af Amer: 87 mL/min/{1.73_m2} (ref >59)
Glucose: 90 mg/dL (ref 65–99)
Potassium: 5.1 mmol/L (ref 3.5–5.2)
Sodium: 140 mmol/L (ref 134–144)

## 2019-04-28 LAB — MAGNESIUM: Magnesium: 2.2 mg/dL (ref 1.6–2.3)

## 2019-04-28 NOTE — Telephone Encounter (Signed)
YOUR CARDIOLOGY TEAM HAS ARRANGED FOR AN E-VISIT FOR YOUR APPOINTMENT - PLEASE REVIEW IMPORTANT INFORMATION BELOW SEVERAL DAYS PRIOR TO YOUR APPOINTMENT ° °Due to the recent COVID-19 pandemic, we are transitioning in-person office visits to tele-medicine visits in an effort to decrease unnecessary exposure to our patients, their families, and staff. These visits are billed to your insurance just like a normal visit is. We also encourage you to sign up for MyChart if you have not already done so. You will need a smartphone if possible. For patients that do not have this, we can still complete the visit using a regular telephone but do prefer a smartphone to enable video when possible. You may have a family member that lives with you that can help. If possible, we also ask that you have a blood pressure cuff and scale at home to measure your blood pressure, heart rate and weight prior to your scheduled appointment. Patients with clinical needs that need an in-person evaluation and testing will still be able to come to the office if absolutely necessary. If you have any questions, feel free to call our office. ° ° ° ° °YOUR PROVIDER WILL BE USING THE FOLLOWING PLATFORM TO COMPLETE YOUR VISIT: Phone Call ° °• IF USING MYCHART - How to Download the MyChart App to Your SmartPhone  ° °- If Apple, go to App Store and type in MyChart in the search bar and download the app. If Android, ask patient to go to Google Play Store and type in MyChart in the search bar and download the app. The app is free but as with any other app downloads, your phone may require you to verify saved payment information or Apple/Android password.  °- You will need to then log into the app with your MyChart username and password, and select [facility] as your healthcare provider to link the account.  °- When it is time for your visit, go to the MyChart app, find appointments, and click Begin Video Visit. Be sure to Select Allow for your device to  access the Microphone and Camera for your visit. You will then be connected, and your provider will be with you shortly. ° **If you have any issues connecting or need assistance, please contact MyChart service desk [phone] ([phone])** ° **If using a computer, in order to ensure the best quality for your visit, you will need to use either of the following Internet Browsers: Google Chrome or Microsoft Edge** ° °• IF USING DOXIMITY or [email or web address] - The staff will give you instructions on receiving your link to join the meeting the day of your visit.  ° ° ° ° °2-3 DAYS BEFORE YOUR APPOINTMENT ° °You will receive a telephone call from one of our [facility] team members - your caller ID may say "Unknown caller." If this is a video visit, we will walk you through how to get the video launched on your phone. We will remind you check your blood pressure, heart rate and weight prior to your scheduled appointment. If you have an Apple Watch or Kardia, please upload any pertinent ECG strips the day before or morning of your appointment to MyChart. Our staff will also make sure you have reviewed the consent and agree to move forward with your scheduled tele-health visit.  ° ° ° °THE DAY OF YOUR APPOINTMENT ° °Approximately 15 minutes prior to your scheduled appointment, you will receive a telephone call from one of [facility] team - your caller ID may say "Unknown caller."    Our staff will confirm medications, vital signs for the day and any symptoms you may be experiencing. Please have this information available prior to the time of visit start. It may also be helpful for you to have a pad of paper and pen handy for any instructions given during your visit. They will also walk you through joining the smartphone meeting if this is a video visit.    CONSENT FOR TELE-HEALTH VISIT - PLEASE REVIEW  I hereby voluntarily request, consent and authorize [facility] and its employed or contracted physicians, physician  assistants, nurse practitioners or other licensed health care professionals (the Practitioner), to provide me with telemedicine health care services (the "Services") as deemed necessary by the treating Practitioner. I acknowledge and consent to receive the Services by the Practitioner via telemedicine. I understand that the telemedicine visit will involve communicating with the Practitioner through live audiovisual communication technology and the disclosure of certain medical information by electronic transmission. I acknowledge that I have been given the opportunity to request an in-person assessment or other available alternative prior to the telemedicine visit and am voluntarily participating in the telemedicine visit.  I understand that I have the right to withhold or withdraw my consent to the use of telemedicine in the course of my care at any time, without affecting my right to future care or treatment, and that the Practitioner or I may terminate the telemedicine visit at any time. I understand that I have the right to inspect all information obtained and/or recorded in the course of the telemedicine visit and may receive copies of available information for a reasonable fee.  I understand that some of the potential risks of receiving the Services via telemedicine include:  . Delay or interruption in medical evaluation due to technological equipment failure or disruption; . Information transmitted may not be sufficient (e.g. poor resolution of images) to allow for appropriate medical decision making by the Practitioner; and/or  . In rare instances, security protocols could fail, causing a breach of personal health information.  Furthermore, I acknowledge that it is my responsibility to provide information about my medical history, conditions and care that is complete and accurate to the best of my ability. I acknowledge that Practitioner's advice, recommendations, and/or decision may be based on  factors not within their control, such as incomplete or inaccurate data provided by me or distortions of diagnostic images or specimens that may result from electronic transmissions. I understand that the practice of medicine is not an exact science and that Practitioner makes no warranties or guarantees regarding treatment outcomes. I acknowledge that I will receive a copy of this consent concurrently upon execution via email to the email address I last provided but may also request a printed copy by calling the office of [facility].    I understand that my insurance will be billed for this visit.   I have read or had this consent read to me. . I understand the contents of this consent, which adequately explains the benefits and risks of the Services being provided via telemedicine.  . I have been provided ample opportunity to ask questions regarding this consent and the Services and have had my questions answered to my satisfaction. . I give my informed consent for the services to be provided through the use of telemedicine in my medical care  By participating in this telemedicine visit I agree to the above.  

## 2019-05-02 NOTE — Progress Notes (Signed)
Virtual Visit via Video Note   This visit type was conducted due to national recommendations for restrictions regarding the COVID-19 Pandemic (e.g. social distancing) in an effort to limit this patient's exposure and mitigate transmission in our community.  Due to her co-morbid illnesses, this patient is at least at moderate risk for complications without adequate follow up.  This format is felt to be most appropriate for this patient at this time.  All issues noted in this document were discussed and addressed.  A limited physical exam was performed with this format.  Please refer to the patient's chart for her consent to telehealth for [facility].   Date:  05/02/2019   ID:  Ann Estes, DOB 08/30/1941, MRN [id number]  Patient Location: Home Provider Location: Home  PCP:  Ann Koch, NP  Cardiologist:  No primary care provider on file.  Electrophysiologist:  None   Evaluation Performed:  Follow-Up Visit  Chief Complaint:  CAD  History of Present Illness:    Ann Estes is a 78 y.o. female with evaluation for palpitations.  Symptomatic palpitations, not better on low dose metoprolol.  She started having issues with palpitations in November.  They are not causing a significant quality of life concern but she wants to be certain that there is not a significant underlying problem.  So far the work-up has included a normal echo with normal EF.  A 2-week monitor demonstrating symptomatic isolated PVCs and also bigeminy, couplets, and triplets.  No nonsustained VT or frank VT.  No A. fib was noted.  Electrolytes are normal.  The patient does not have symptoms concerning for COVID-19 infection (fever, chills, cough, or new shortness of breath).    Past Medical History:  Diagnosis Date  . BPPV (benign paroxysmal positional vertigo)   . Chicken pox   . Glaucoma   . Hyperlipidemia   . Osteoarthritis   . Palpitations 10/13/2018   Past Surgical History:  Procedure Laterality  Date  . AUGMENTATION MAMMAPLASTY Bilateral ?   Impants in and then removed   . BREAST IMPLANT REMOVAL    . CATARACT EXTRACTION Bilateral   . COSMETIC SURGERY     Eyelids, tummy tuck  . PLACEMENT OF BREAST IMPLANTS    . 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 TOTAL HIP ARTHROPLASTY Left 02/2005  . TRIGGER FINGER RELEASE  11/2016   Thumb     No outpatient medications have been marked as taking for the 05/03/19 encounter (Appointment) with Belva Crome, MD.     Allergies:   Patient has no known allergies.   Social History   Tobacco Use  . Smoking status: Former Research scientist (life sciences)  . Smokeless tobacco: Never Used  Substance Use Topics  . Alcohol use: Yes  . Drug use: No     Family Hx: The patient's family history includes Arthritis in her son; Asthma in her sister and sister; Birth defects in her daughter; Cancer in her father and maternal grandmother; Depression in her sister; Heart attack in her maternal grandfather; Heart disease in her mother.  ROS:   Please see the history of present illness.    Prior CV studies:   The following studies were reviewed today:      Prolonged monitor 04/21/2019 Study Highlights    Underlying rhythm is NSR  Frequent PVC's occuring isolated, in bigeminy, and occasional couplets, triplets, and one 4 beat run  PVC;s are multiform  SVT up to 16 beats  No apparent cerrelation between symptoms and rhythm  disturbance.    ECHOCARDIOGRAM 04/27/19:  IMPRESSIONS    1. The left ventricle has normal systolic function, with an ejection fraction of 55-60%. The cavity size was normal. Left ventricular diastolic Doppler parameters are consistent with impaired relaxation. Elevated left atrial and left ventricular  end-diastolic pressures.  2. The right ventricle has normal systolic function. The cavity was normal. There is no increase in right ventricular wall thickness.  3. Mild thickening of the aortic valve.  4. The aortic root and ascending aorta  are normal in size and structure.  All other systems reviewed and are negative.   Labs/Other Tests and Data Reviewed:    EKG:  No ECG reviewed.  Recent Labs: 10/13/2018: Hemoglobin 13.9; Platelets 279.0; TSH 1.17 02/08/2019: ALT 14 04/27/2019: BUN 18; Creatinine, Ser 0.63; Magnesium 2.2; Potassium 5.1; Sodium 140   Recent Lipid Panel Lab Results  Component Value Date/Time   CHOL 195 02/08/2019 11:07 AM   TRIG 133.0 02/08/2019 11:07 AM   HDL 71.80 02/08/2019 11:07 AM   CHOLHDL 3 02/08/2019 11:07 AM   LDLCALC 97 02/08/2019 11:07 AM    Wt Readings from Last 3 Encounters:  03/31/19 123 lb (55.8 kg)  01/29/19 125 lb 8 oz (56.9 kg)  12/09/18 126 lb 12 oz (57.5 kg)     Objective:    Vital Signs:  There were no vitals taken for this visit.   VITAL SIGNS:  reviewed GEN:  no acute distress RESPIRATORY:  normal respiratory effort, symmetric expansion CARDIOVASCULAR:  no peripheral edema NEURO:  alert and oriented x 3, no obvious focal deficit  ASSESSMENT & PLAN:    1. PVC (premature ventricular contraction)   2. Hyperlipidemia, unspecified hyperlipidemia type   3. DOE (dyspnea on exertion)   4. ECG abnormality   5. Educated About Covid-19 Virus Infection    PLAN:  1. 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  We will plan a stress Myoview to evaluate for exercise-induced suppression of ventricular ectopy and to rule out myocardial ischemia.  Significant discussion relative to findings of work-up to this point.  Unfortunately, the monitor that was performed could not quantitate the burden of PVC activity.  COVID-19 Education: The signs and symptoms of COVID-19 were discussed with the patient and how to seek care for testing (follow up with PCP or arrange E-visit).  2The importance of social distancing was discussed today.  Time:   Today, I have spent 16 minutes with the patient with  telehealth technology discussing the above problems.     Medication Adjustments/Labs and Tests Ordered: Current medicines are reviewed at length with the patient today.  Concerns regarding medicines are outlined above.   Tests Ordered: No orders of the defined types were placed in this encounter.   Medication Changes: No orders of the defined types were placed in this encounter.   Disposition:  Follow up prn  Signed, Sinclair Grooms, MD  05/02/2019 9:41 AM    [place]

## 2019-05-03 ENCOUNTER — Other Ambulatory Visit: Payer: Self-pay

## 2019-05-03 ENCOUNTER — Encounter: Payer: Self-pay | Admitting: Interventional Cardiology

## 2019-05-03 ENCOUNTER — Telehealth (INDEPENDENT_AMBULATORY_CARE_PROVIDER_SITE_OTHER): Payer: Medicare Other | Admitting: Interventional Cardiology

## 2019-05-03 VITALS — BP 118/70 | HR 60 | Ht 61.5 in | Wt 123.5 lb

## 2019-05-03 DIAGNOSIS — I493 Ventricular premature depolarization: Secondary | ICD-10-CM | POA: Diagnosis not present

## 2019-05-03 DIAGNOSIS — R9431 Abnormal electrocardiogram [ECG] [EKG]: Secondary | ICD-10-CM

## 2019-05-03 DIAGNOSIS — E785 Hyperlipidemia, unspecified: Secondary | ICD-10-CM

## 2019-05-03 DIAGNOSIS — R0609 Other forms of dyspnea: Secondary | ICD-10-CM

## 2019-05-03 DIAGNOSIS — Z7189 Other specified counseling: Secondary | ICD-10-CM

## 2019-05-03 NOTE — Patient Instructions (Signed)
Medication Instructions:  Your physician recommends that you continue on your current medications as directed. Please refer to the Current Medication list given to you today.  If you need a refill on your cardiac medications before your next appointment, please call your pharmacy.   Lab work: None If you have labs (blood work) drawn today and your tests are completely normal, you will receive your results only by: Marland Kitchen MyChart Message (if you have MyChart) OR . A paper copy in the mail If you have any lab test that is abnormal or we need to change your treatment, we will call you to review the results.  Testing/Procedures: Your physician has requested that you have en exercise stress myoview. For further information please visit [email or web address]. Please follow instruction sheet, as given.   Follow-Up: Your physician recommends that you schedule a follow-up appointment as needed with Dr. Tamala Julian.   Any Other Special Instructions Will Be Listed Below (If Applicable).

## 2019-05-04 ENCOUNTER — Other Ambulatory Visit: Payer: Self-pay | Admitting: *Deleted

## 2019-05-04 DIAGNOSIS — Z7189 Other specified counseling: Secondary | ICD-10-CM

## 2019-05-04 DIAGNOSIS — Z01812 Encounter for preprocedural laboratory examination: Secondary | ICD-10-CM

## 2019-05-10 ENCOUNTER — Other Ambulatory Visit (HOSPITAL_COMMUNITY)
Admission: RE | Admit: 2019-05-10 | Discharge: 2019-05-10 | Disposition: A | Discharge status: Home / Self Care | Payer: Medicare Other | Source: Ambulatory Visit | Attending: Interventional Cardiology | Admitting: Interventional Cardiology

## 2019-05-10 DIAGNOSIS — Z1159 Encounter for screening for other viral diseases: Secondary | ICD-10-CM | POA: Diagnosis not present

## 2019-05-12 ENCOUNTER — Telehealth (HOSPITAL_COMMUNITY): Payer: Self-pay | Admitting: *Deleted

## 2019-05-12 LAB — NOVEL CORONAVIRUS, NAA (HOSP ORDER, SEND-OUT TO REF LAB; TAT 18-24 HRS): SARS-CoV-2, NAA: NOT DETECTED

## 2019-05-12 NOTE — Telephone Encounter (Signed)
Patient given detailed instructions per Myocardial Perfusion Study Information Sheet for the test on 05/14/19 at 11:00. Patient notified to arrive 15 minutes early and that it is imperative to arrive on time for appointment to keep from having the test rescheduled.  If you need to cancel or reschedule your appointment, please call the office within 24 hours of your appointment. . Patient verbalized understanding.Ann Estes

## 2019-05-14 ENCOUNTER — Ambulatory Visit (HOSPITAL_COMMUNITY): Payer: Medicare Other | Attending: Internal Medicine

## 2019-05-14 ENCOUNTER — Other Ambulatory Visit: Payer: Self-pay

## 2019-05-14 DIAGNOSIS — I493 Ventricular premature depolarization: Secondary | ICD-10-CM | POA: Diagnosis not present

## 2019-05-14 DIAGNOSIS — R0609 Other forms of dyspnea: Secondary | ICD-10-CM | POA: Diagnosis not present

## 2019-05-14 LAB — MYOCARDIAL PERFUSION IMAGING
Estimated workload: 7 METS
Exercise duration (min): 5 min
Exercise duration (sec): 0 s
LV dias vol: 68 mL (ref 46–106)
LV sys vol: 28 mL
MPHR: 144 {beats}/min
Peak HR: 134 {beats}/min
Percent HR: 93 %
Rest HR: 78 {beats}/min
SDS: 1
SRS: 1
SSS: 2
TID: 0.97

## 2019-05-14 MED ORDER — TECHNETIUM TC 99M TETROFOSMIN IV KIT
11.0000 | PACK | Freq: Once | INTRAVENOUS | Status: AC | PRN
  Administered 2019-05-14: 11 via INTRAVENOUS
  Filled 2019-05-14: qty 11

## 2019-05-14 MED ORDER — TECHNETIUM TC 99M TETROFOSMIN IV KIT
32.5000 | PACK | Freq: Once | INTRAVENOUS | Status: AC | PRN
  Administered 2019-05-14: 32.5 via INTRAVENOUS
  Filled 2019-05-14: qty 33

## 2019-06-29 DIAGNOSIS — H401111 Primary open-angle glaucoma, right eye, mild stage: Secondary | ICD-10-CM | POA: Diagnosis not present

## 2019-06-30 ENCOUNTER — Other Ambulatory Visit: Payer: Self-pay | Admitting: Primary Care

## 2019-06-30 ENCOUNTER — Ambulatory Visit
Admission: RE | Admit: 2019-06-30 | Discharge: 2019-06-30 | Disposition: A | Discharge status: Home / Self Care | Payer: Medicare Other | Source: Ambulatory Visit | Attending: Primary Care | Admitting: Primary Care

## 2019-06-30 DIAGNOSIS — E2839 Other primary ovarian failure: Secondary | ICD-10-CM | POA: Diagnosis not present

## 2019-06-30 DIAGNOSIS — Z780 Asymptomatic menopausal state: Secondary | ICD-10-CM | POA: Diagnosis not present

## 2019-06-30 DIAGNOSIS — M810 Age-related osteoporosis without current pathological fracture: Secondary | ICD-10-CM | POA: Diagnosis not present

## 2019-06-30 DIAGNOSIS — M1990 Unspecified osteoarthritis, unspecified site: Secondary | ICD-10-CM

## 2019-06-30 NOTE — Telephone Encounter (Signed)
Last prescribed on 01/04/2019. Last appointment on 02/15/2019. No future appointment

## 2019-06-30 NOTE — Telephone Encounter (Signed)
Refill sent to pharmacy.   

## 2019-07-11 NOTE — Progress Notes (Signed)
Cardiology Office Note:    Date:  07/12/2019   ID:  Ann Estes, DOB [birth year], MRN [id number]  PCP:  Pleas Koch, NP  Cardiologist:  No primary care provider on file.   Referring MD: Pleas Koch, NP   Chief Complaint  Patient presents with  . Advice Only    Family history atrial fibrillation  . Irregular Heart Beat    History of Present Illness:    Ann Estes is a 78 y.o. female with a hx of palpitations --> PVC's and NSSVT, and hyperlipidemia.   Still has palpitations that have not been influenced by metoprolol.  No side effects from metoprolol.  No chest pain, orthopnea, PND, or prolonged tachycardia.  She has been monitoring her blood pressure at home with a new device and all of these are normal.  Past Medical History:  Diagnosis Date  . BPPV (benign paroxysmal positional vertigo)   . Chicken pox   . Glaucoma   . Hyperlipidemia   . Osteoarthritis   . Palpitations 10/13/2018    Past Surgical History:  Procedure Laterality Date  . AUGMENTATION MAMMAPLASTY Bilateral ?   Impants in and then removed   . BREAST IMPLANT REMOVAL    . CATARACT EXTRACTION Bilateral   . COSMETIC SURGERY     Eyelids, tummy tuck  . PLACEMENT OF BREAST IMPLANTS    . 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 TOTAL HIP ARTHROPLASTY Left 02/2005  . TRIGGER FINGER RELEASE  11/2016   Thumb    Current Medications: Current Meds  Medication Sig  . Cholecalciferol (VITAMIN D3) 2000 units TABS Take by mouth.  Nyoka Cowden Tea, Camillia sinensis, (GREEN TEA PO) Take by mouth.   Marland Kitchen KRILL OIL PO Take 1 capsule by mouth every other day.  . latanoprost (XALATAN) 0.005 % ophthalmic solution INSTILL 1 DROP INTO BOTH EYES EVERY DAY  . meloxicam (MOBIC) 7.5 MG tablet TAKE 1 TABLET (7.5 MG TOTAL) BY MOUTH 2 (TWO) TIMES DAILY AS NEEDED FOR PAIN.  . metoprolol succinate (TOPROL-XL) 25 MG 24 hr tablet Take 1 tablet (25 mg total) by mouth daily.  . Multiple Vitamin (MULTIVITAMIN) tablet Take 1 tablet  by mouth daily.     Allergies:   Patient has no known allergies.   Social History   Socioeconomic History  . Marital status: Divorced    Spouse name: Not on file  . Number of children: Not on file  . Years of education: Not on file  . Highest education level: Not on file  Occupational History  . Not on file  Social Needs  . Financial resource strain: Not on file  . Food insecurity    Worry: Not on file    Inability: Not on file  . Transportation needs    Medical: Not on file    Non-medical: Not on file  Tobacco Use  . Smoking status: Former Research scientist (life sciences)  . Smokeless tobacco: Never Used  Substance and Sexual Activity  . Alcohol use: Yes  . Drug use: No  . Sexual activity: Not on file  Lifestyle  . Physical activity    Days per week: Not on file    Minutes per session: Not on file  . Stress: Not on file  Relationships  . Social Herbalist on phone: Not on file    Gets together: Not on file    Attends religious service: Not on file    Active member of club or organization: Not on file  Attends meetings of clubs or organizations: Not on file    Relationship status: Not on file  Other Topics Concern  . Not on file  Social History Narrative  . Not on file     Family History: The patient's family history includes Arthritis in her son; Asthma in her sister and sister; Birth defects in her daughter; Cancer in her father and maternal grandmother; Depression in her sister; Heart attack in her maternal grandfather; Heart disease in her mother.  ROS:   Please see the history of present illness.    No significant complaints other than some anxiety about her family heart history which includes her mother who had an MI in her 76s, sister has atrial fibrillation and PVCs.  Another sister had aortic dissection or coronary dissection and ended up with a defibrillator.  All other systems reviewed and are negative.  EKGs/Labs/Other Studies Reviewed:    The following studies  were reviewed today:  CONTINUOUS MONITOR 04/21/19: Study Highlights   Underlying rhythm is NSR  Frequent PVC's occuring isolated, in bigeminy, and occasional couplets, triplets, and one 4 beat run.  PVC burden 7.2%  PVC;s are multiform  SVT up to 16 beats  No apparent cerrelation between symptoms and rhythm disturbance.     ECHOCARDIOGRAM 2020: IMPRESSIONS    1. The left ventricle has normal systolic function, with an ejection fraction of 55-60%. The cavity size was normal. Left ventricular diastolic Doppler parameters are consistent with impaired relaxation. Elevated left atrial and left ventricular  end-diastolic pressures.  2. The right ventricle has normal systolic function. The cavity was normal. There is no increase in right ventricular wall thickness.  3. Mild thickening of the aortic valve.  4. The aortic root and ascending aorta are normal in size and structure.  STRESS MYOVIEW 04/2019: Study Highlights    Nuclear stress EF: 58%.  Blood pressure demonstrated a normal response to exercise.  There was no ST segment deviation noted during stress.  No T wave inversion was noted during stress.  Defect 1: There is a small defect of mild severity.  This is a low risk study.   No significant reversible ischemia. LVEF 58% with normal wall motion. This is a low risk study.     EKG:  EKG none  Recent Labs: 10/13/2018: Hemoglobin 13.9; Platelets 279.0; TSH 1.17 02/08/2019: ALT 14 04/27/2019: BUN 18; Creatinine, Ser 0.63; Magnesium 2.2; Potassium 5.1; Sodium 140  Recent Lipid Panel    Component Value Date/Time   CHOL 195 02/08/2019 1107   TRIG 133.0 02/08/2019 1107   HDL 71.80 02/08/2019 1107   CHOLHDL 3 02/08/2019 1107   VLDL 26.6 02/08/2019 1107   LDLCALC 97 02/08/2019 1107    Physical Exam:    VS:  BP 124/64   Pulse 77   Ht 5' 1.5" (1.562 m)   Wt 126 lb (57.2 kg)   SpO2 96%   BMI 23.42 kg/m     Wt Readings from Last 3 Encounters:  07/12/19 126  lb (57.2 kg)  05/14/19 123 lb (55.8 kg)  05/03/19 123 lb 8 oz (56 kg)     GEN: Elderly but perky.. No acute distress HEENT: Normal NECK: No JVD. LYMPHATICS: No lymphadenopathy CARDIAC:  RRR without murmur, gallop, or edema. VASCULAR:  Normal Pulses. No bruits. RESPIRATORY:  Clear to auscultation without rales, wheezing or rhonchi  ABDOMEN: Soft, non-tender, non-distended, No pulsatile mass, MUSCULOSKELETAL: No deformity  SKIN: Warm and dry NEUROLOGIC:  Alert and oriented x 3 PSYCHIATRIC:  Normal affect   ASSESSMENT:    1. PVC (premature ventricular contraction)   2. DOE (dyspnea on exertion)   3. Hyperlipidemia, unspecified hyperlipidemia type   4. ECG abnormality   5. Educated About Covid-19 Virus Infection    PLAN:    In order of problems listed above:  1. PVC burden less than 8%.  No change in palpitations on Toprol-XL.  She has on the monitor some nonsustained SVT.  She would be at risk for development of atrial fibrillation at some point in the future.  Any prolonged rapid heart beating should warrant clinical reevaluation. 2. No complaint of dyspnea 3. Lipids are reasonable with LDL of 97.  No therapy is warranted with negative nuclear study at her age. 4. EKG is not repeated 5. Handwashing, social distancing, and mask wearing is discussed in detail and encouraged.  We will have clinical follow-up in 9 to 12 months.  She should call us if any prolonged tachycardia or if palpitations become symptomatic. 6. Not addressed 7.    Medication Adjustments/Labs and Tests Ordered: Current medicines are reviewed at length with the patient today.  Concerns regarding medicines are outlined above.  No orders of the defined types were placed in this encounter.  No orders of the defined types were placed in this encounter.   Patient Instructions  Medication Instructions:  Your physician recommends that you continue on your current medications as directed. Please refer to the  Current Medication list given to you today.  If you need a refill on your cardiac medications before your next appointment, please call your pharmacy.   Lab work: None If you have labs (blood work) drawn today and your tests are completely normal, you will receive your results only by: Marland Kitchen MyChart Message (if you have MyChart) OR . A paper copy in the mail If you have any lab test that is abnormal or we need to change your treatment, we will call you to review the results.  Testing/Procedures: None  Follow-Up: At [facility], you and your health needs are our priority.  As part of our continuing mission to provide you with exceptional heart care, we have created designated Provider Care Teams.  These Care Teams include your primary Cardiologist (physician) and Advanced Practice Providers (APPs -  Physician Assistants and Nurse Practitioners) who all work together to provide you with the care you need, when you need it. You will need a follow up appointment in 9-12 months.  Please call our office 2 months in advance to schedule this appointment.  You may see Dr. Tamala Julian or one of the following Advanced Practice Providers on your designated Care Team:   Truitt Merle, NP Cecilie Kicks, NP . Kathyrn Drown, NP  Any Other Special Instructions Will Be Listed Below (If Applicable).       Signed, Sinclair Grooms, MD  07/12/2019 4:26 PM    [place]

## 2019-07-12 ENCOUNTER — Ambulatory Visit (INDEPENDENT_AMBULATORY_CARE_PROVIDER_SITE_OTHER): Payer: Medicare Other | Admitting: Interventional Cardiology

## 2019-07-12 ENCOUNTER — Encounter: Payer: Self-pay | Admitting: Interventional Cardiology

## 2019-07-12 ENCOUNTER — Other Ambulatory Visit: Payer: Self-pay

## 2019-07-12 VITALS — BP 124/64 | HR 77 | Ht 61.5 in | Wt 126.0 lb

## 2019-07-12 DIAGNOSIS — R0609 Other forms of dyspnea: Secondary | ICD-10-CM

## 2019-07-12 DIAGNOSIS — I493 Ventricular premature depolarization: Secondary | ICD-10-CM

## 2019-07-12 DIAGNOSIS — Z87891 Personal history of nicotine dependence: Secondary | ICD-10-CM | POA: Diagnosis not present

## 2019-07-12 DIAGNOSIS — Z7189 Other specified counseling: Secondary | ICD-10-CM

## 2019-07-12 DIAGNOSIS — E785 Hyperlipidemia, unspecified: Secondary | ICD-10-CM

## 2019-07-12 DIAGNOSIS — R9431 Abnormal electrocardiogram [ECG] [EKG]: Secondary | ICD-10-CM

## 2019-07-12 NOTE — Patient Instructions (Signed)
Medication Instructions:  Your physician recommends that you continue on your current medications as directed. Please refer to the Current Medication list given to you today.  If you need a refill on your cardiac medications before your next appointment, please call your pharmacy.   Lab work: None If you have labs (blood work) drawn today and your tests are completely normal, you will receive your results only by: . MyChart Message (if you have MyChart) OR . A paper copy in the mail If you have any lab test that is abnormal or we need to change your treatment, we will call you to review the results.  Testing/Procedures: None  Follow-Up: At [facility], you and your health needs are our priority.  As part of our continuing mission to provide you with exceptional heart care, we have created designated Provider Care Teams.  These Care Teams include your primary Cardiologist (physician) and Advanced Practice Providers (APPs -  Physician Assistants and Nurse Practitioners) who all work together to provide you with the care you need, when you need it. You will need a follow up appointment in 9-12 months.  Please call our office 2 months in advance to schedule this appointment.  You may see Dr. Smith or one of the following Advanced Practice Providers on your designated Care Team:   Lori Gerhardt, NP Laura Ingold, NP . Jill McDaniel, NP  Any Other Special Instructions Will Be Listed Below (If Applicable).    

## 2019-08-06 DIAGNOSIS — Z23 Encounter for immunization: Secondary | ICD-10-CM | POA: Diagnosis not present

## 2019-08-16 DIAGNOSIS — M6530 Trigger finger, unspecified finger: Secondary | ICD-10-CM

## 2019-08-30 DIAGNOSIS — M65331 Trigger finger, right middle finger: Secondary | ICD-10-CM | POA: Diagnosis not present

## 2019-09-13 DIAGNOSIS — M65331 Trigger finger, right middle finger: Secondary | ICD-10-CM | POA: Diagnosis not present

## 2019-12-04 ENCOUNTER — Ambulatory Visit: Payer: Medicare Other | Attending: Internal Medicine

## 2019-12-04 DIAGNOSIS — Z23 Encounter for immunization: Secondary | ICD-10-CM | POA: Diagnosis not present

## 2019-12-04 NOTE — Progress Notes (Signed)
   Covid-19 Vaccination Clinic  Name:  Ann Estes    MRN: [id number] DOB: 05-26-1941  12/04/2019  [name] was observed post Covid-19 immunization for 15 minutes without incidence. She was provided with Vaccine Information Sheet and instruction to access the V-Safe system.   [name] was instructed to call 911 with any severe reactions post vaccine: Marland Kitchen Difficulty breathing  . Swelling of your face and throat  . A fast heartbeat  . A bad rash all over your body  . Dizziness and weakness    Immunizations Administered    Name Date Dose VIS Date Route   Pfizer COVID-19 Vaccine 12/04/2019 12:52 PM 0.3 mL 11/05/2019 Intramuscular   Manufacturer: [place]   Lot: [id number]   [place]: [id number]

## 2019-12-07 ENCOUNTER — Ambulatory Visit
Admission: RE | Admit: 2019-12-07 | Discharge: 2019-12-07 | Disposition: A | Discharge status: Home / Self Care | Payer: Medicare Other | Source: Ambulatory Visit | Attending: Otolaryngology | Admitting: Otolaryngology

## 2019-12-07 ENCOUNTER — Other Ambulatory Visit: Payer: Self-pay

## 2019-12-07 ENCOUNTER — Other Ambulatory Visit: Payer: Self-pay | Admitting: Otolaryngology

## 2019-12-07 DIAGNOSIS — R05 Cough: Secondary | ICD-10-CM | POA: Diagnosis not present

## 2019-12-07 DIAGNOSIS — R059 Cough, unspecified: Secondary | ICD-10-CM

## 2019-12-25 ENCOUNTER — Ambulatory Visit: Payer: Medicare Other | Attending: Internal Medicine

## 2019-12-25 DIAGNOSIS — Z23 Encounter for immunization: Secondary | ICD-10-CM | POA: Insufficient documentation

## 2019-12-25 NOTE — Progress Notes (Signed)
   Covid-19 Vaccination Clinic  Name:  Ann Estes    MRN: [id number] DOB: 12-31-1940  12/25/2019  Ann Estes was observed post Covid-19 immunization for 15 minutes without incidence. She was provided with Vaccine Information Sheet and instruction to access the V-Safe system.   Ann Estes was instructed to call 911 with any severe reactions post vaccine: Marland Kitchen Difficulty breathing  . Swelling of your face and throat  . A fast heartbeat  . A bad rash all over your body  . Dizziness and weakness    Immunizations Administered    Name Date Dose VIS Date Route   Pfizer COVID-19 Vaccine 12/25/2019 11:52 AM 0.3 mL 11/05/2019 Intramuscular   Manufacturer: [place]   Lot: [id number]   [place]: [id number]

## 2019-12-30 DIAGNOSIS — H401122 Primary open-angle glaucoma, left eye, moderate stage: Secondary | ICD-10-CM | POA: Diagnosis not present

## 2020-01-02 ENCOUNTER — Other Ambulatory Visit: Payer: Self-pay | Admitting: Primary Care

## 2020-01-02 DIAGNOSIS — M1990 Unspecified osteoarthritis, unspecified site: Secondary | ICD-10-CM

## 2020-01-19 DIAGNOSIS — R05 Cough: Secondary | ICD-10-CM | POA: Diagnosis not present

## 2020-01-19 DIAGNOSIS — K219 Gastro-esophageal reflux disease without esophagitis: Secondary | ICD-10-CM | POA: Diagnosis not present

## 2020-02-03 ENCOUNTER — Other Ambulatory Visit: Payer: Self-pay | Admitting: Primary Care

## 2020-02-03 DIAGNOSIS — M1990 Unspecified osteoarthritis, unspecified site: Secondary | ICD-10-CM

## 2020-02-17 ENCOUNTER — Other Ambulatory Visit: Payer: Self-pay | Admitting: Primary Care

## 2020-02-17 DIAGNOSIS — E785 Hyperlipidemia, unspecified: Secondary | ICD-10-CM

## 2020-02-29 ENCOUNTER — Other Ambulatory Visit (INDEPENDENT_AMBULATORY_CARE_PROVIDER_SITE_OTHER): Payer: Medicare Other

## 2020-02-29 ENCOUNTER — Ambulatory Visit (INDEPENDENT_AMBULATORY_CARE_PROVIDER_SITE_OTHER): Payer: Medicare Other

## 2020-02-29 ENCOUNTER — Other Ambulatory Visit: Payer: Self-pay

## 2020-02-29 VITALS — Wt 130.0 lb

## 2020-02-29 DIAGNOSIS — Z0000 Encounter for general adult medical examination without abnormal findings: Secondary | ICD-10-CM

## 2020-02-29 DIAGNOSIS — E785 Hyperlipidemia, unspecified: Secondary | ICD-10-CM

## 2020-02-29 LAB — LIPID PANEL
Cholesterol: 220 mg/dL — ABNORMAL HIGH (ref 0–200)
HDL: 74 mg/dL (ref >39.00)
LDL Cholesterol: 121 mg/dL — ABNORMAL HIGH (ref 0–99)
NonHDL: 146.06
Total CHOL/HDL Ratio: 3
Triglycerides: 123 mg/dL (ref 0.0–149.0)
VLDL: 24.6 mg/dL (ref 0.0–40.0)

## 2020-02-29 LAB — COMPREHENSIVE METABOLIC PANEL
ALT: 10 U/L (ref 0–35)
AST: 19 U/L (ref 0–37)
Albumin: 4.6 g/dL (ref 3.5–5.2)
Alkaline Phosphatase: 70 U/L (ref 39–117)
BUN: 19 mg/dL (ref 6–23)
CO2: 28 mEq/L (ref 19–32)
Calcium: 10 mg/dL (ref 8.4–10.5)
Chloride: 103 mEq/L (ref 96–112)
Creatinine, Ser: 0.76 mg/dL (ref 0.40–1.20)
GFR: 73.77 mL/min (ref >60.00)
Glucose, Bld: 94 mg/dL (ref 70–99)
Potassium: 4.7 mEq/L (ref 3.5–5.1)
Sodium: 139 mEq/L (ref 135–145)
Total Bilirubin: 1.3 mg/dL — ABNORMAL HIGH (ref 0.2–1.2)
Total Protein: 6.8 g/dL (ref 6.0–8.3)

## 2020-02-29 NOTE — Progress Notes (Signed)
PCP notes:  Health Maintenance: Colonoscopy- FOBT due per patient Mammogram- due   Abnormal Screenings: none   Patient concerns: none   Nurse concerns: none   Next PCP appt.: 03/07/2020 @ 3:20 pm

## 2020-02-29 NOTE — Progress Notes (Signed)
Subjective:   Ann Estes is a 79 y.o. female who presents for Medicare Annual (Subsequent) preventive examination.  Review of Systems: N/A   This visit is being conducted through telemedicine via telephone at the nurse health advisor's home address due to the COVID-19 pandemic. This patient has given me verbal consent via doximity to conduct this visit, patient states they are participating from their home address. Patient and myself are on the telephone call. There is no referral for this visit. Some vital signs may be absent or patient reported.    Patient identification: identified by name, DOB, and current address   Cardiac Risk Factors include: advanced age (>1men, >17 women);dyslipidemia     Objective:     Vitals: Wt 130 lb (59 kg)   BMI 24.17 kg/m   Body mass index is 24.17 kg/m.  Advanced Directives 02/29/2020 02/04/2018  Does Patient Have a Medical Advance Directive? Yes Yes  Type of Paramedic of [place];Living will [place] in Chart? No - copy requested No - copy requested    Tobacco Social History   Tobacco Use  Smoking Status Former Smoker  Smokeless Tobacco Never Used     Counseling given: Not Answered   Clinical Intake:  Pre-visit preparation completed: Yes  Pain : No/denies pain     Nutritional Risks: None Diabetes: No  How often do you need to have someone help you when you read instructions, pamphlets, or other written materials from your doctor or pharmacy?: 1 - Never What is the last grade level you completed in school?: college  Interpreter Needed?: No  Information entered by :: CJohnson, LPN  Past Medical History:  Diagnosis Date  . BPPV (benign paroxysmal positional vertigo)   . Chicken pox   . Glaucoma   . Hyperlipidemia   . Osteoarthritis   . Palpitations 10/13/2018   Past Surgical History:  Procedure Laterality Date  . AUGMENTATION  MAMMAPLASTY Bilateral ?   Impants in and then removed   . BREAST IMPLANT REMOVAL    . CATARACT EXTRACTION Bilateral   . COSMETIC SURGERY     Eyelids, tummy tuck  . PLACEMENT OF BREAST IMPLANTS    . 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 TOTAL HIP ARTHROPLASTY Left 02/2005  . TRIGGER FINGER RELEASE  11/2016   Thumb   Family History  Problem Relation Age of Onset  . Heart disease Mother   . Cancer Father   . Birth defects Daughter   . Arthritis Son   . Cancer Maternal Grandmother   . Heart attack Maternal Grandfather   . Asthma Sister   . Depression Sister   . Asthma Sister    Social History   Socioeconomic History  . Marital status: Divorced    Spouse name: Not on file  . Number of children: Not on file  . Years of education: Not on file  . Highest education level: Not on file  Occupational History  . Not on file  Tobacco Use  . Smoking status: Former Research scientist (life sciences)  . Smokeless tobacco: Never Used  Substance and Sexual Activity  . Alcohol use: Yes    Comment: twice a year  . Drug use: No  . Sexual activity: Not on file  Other Topics Concern  . Not on file  Social History Narrative  . Not on file   Social Determinants of Health   Financial Resource Strain: Low Risk   . Difficulty of  Paying Living Expenses: Not hard at all  Food Insecurity: No Food Insecurity  . Worried About Charity fundraiser in the Last Year: Never true  . Ran Out of Food in the Last Year: Never true  Transportation Needs: No Transportation Needs  . Lack of Transportation (Medical): No  . Lack of Transportation (Non-Medical): No  Physical Activity: Sufficiently Active  . Days of Exercise per Week: 7 days  . Minutes of Exercise per Session: 50 min  Stress: No Stress Concern Present  . Feeling of Stress : Not at all  Social Connections:   . Frequency of Communication with Friends and Family:   . Frequency of Social Gatherings with Friends and Family:   . Attends Religious Services:   .  Active Member of Clubs or Organizations:   . Attends Archivist Meetings:   Marland Kitchen Marital Status:     Outpatient Encounter Medications as of 02/29/2020  Medication Sig  . Cholecalciferol (VITAMIN D3) 2000 units TABS Take by mouth.  Nyoka Cowden Tea, Camillia sinensis, (GREEN TEA PO) Take by mouth.   Marland Kitchen KRILL OIL PO Take 1 capsule by mouth every other day.  . latanoprost (XALATAN) 0.005 % ophthalmic solution INSTILL 1 DROP INTO BOTH EYES EVERY DAY  . meloxicam (MOBIC) 7.5 MG tablet TAKE 1 TABLET (7.5 MG TOTAL) BY MOUTH 2 (TWO) TIMES DAILY AS NEEDED FOR PAIN.  . metoprolol succinate (TOPROL-XL) 25 MG 24 hr tablet Take 1 tablet (25 mg total) by mouth daily.  . Multiple Vitamin (MULTIVITAMIN) tablet Take 1 tablet by mouth daily.   No facility-administered encounter medications on file as of 02/29/2020.    Activities of Daily Living In your present state of health, do you have any difficulty performing the following activities: 02/29/2020  Hearing? N  Vision? N  Difficulty concentrating or making decisions? N  Walking or climbing stairs? N  Dressing or bathing? N  Doing errands, shopping? N  Preparing Food and eating ? N  Using the Toilet? N  In the past six months, have you accidently leaked urine? N  Do you have problems with loss of bowel control? N  Managing your Medications? N  Managing your Finances? N  Housekeeping or managing your Housekeeping? N  Some recent data might be hidden    Patient Care Team: Pleas Koch, NP as PCP - General (Internal Medicine) Leandrew Koyanagi, MD as Referring Physician (Ophthalmology)    Assessment:   This is a routine wellness examination for Ann Estes.  Exercise Activities and Dietary recommendations Current Exercise Habits: Home exercise routine, Type of exercise: walking, Time (Minutes): 45, Frequency (Times/Week): 7, Weekly Exercise (Minutes/Week): 315, Intensity: Moderate, Exercise limited by: None identified  Goals    . Increase  physical activity     Starting 02/04/2018, I will continue to walk at least 3 miles daily as weather permits.     . Patient Stated     02/29/2020, I will continue to walk everyday for about 30 minutes to 1 hour.       Fall Risk Fall Risk  02/29/2020 02/15/2019 02/04/2018  Falls in the past year? 0 0 No  Number falls in past yr: 0 0 -  Injury with Fall? 0 0 -  Risk for fall due to : Medication side effect - -  Follow up Falls evaluation completed;Falls prevention discussed Falls evaluation completed -   Is the patient's home free of loose throw rugs in walkways, pet beds, electrical cords, etc?   yes  Grab bars in the bathroom? no      Handrails on the stairs?   yes      Adequate lighting?   yes  Timed Get Up and Go performed: N/A  Depression Screen PHQ 2/9 Scores 02/29/2020 02/15/2019 02/04/2018  PHQ - 2 Score 0 0 0  PHQ- 9 Score 0 - 0     Cognitive Function MMSE - Mini Mental State Exam 02/29/2020 02/04/2018  Orientation to time 5 5  Orientation to Place 5 5  Registration 3 3  Attention/ Calculation 5 0  Recall 3 3  Language- name 2 objects - 0  Language- repeat 1 1  Language- follow 3 step command - 3  Language- read & follow direction - 0  Write a sentence - 0  Copy design - 0  Total score - 20  Mini Cog  Mini-Cog screen was completed. Maximum score is 22. A value of 0 denotes this part of the MMSE was not completed or the patient failed this part of the Mini-Cog screening.       Immunization History  Administered Date(s) Administered  . Fluad Quad(high Dose 65+) 08/06/2019  . Influenza, High Dose Seasonal PF 10/05/2018  . Influenza,inj,Quad PF,6+ Mos 08/11/2017  . PFIZER SARS-COV-2 Vaccination 12/04/2019, 12/25/2019  . Pneumococcal Conjugate-13 11/13/2013  . Pneumococcal Polysaccharide-23 12/22/2014  . Td 07/23/2010  . Zoster 06/27/2009  . Zoster Recombinat (Shingrix) 05/19/2018, 09/26/2018    Qualifies for Shingles Vaccine: Completed series   Screening  Tests Health Maintenance  Topic Date Due  . INFLUENZA VACCINE  06/25/2020  . TETANUS/TDAP  07/23/2020  . DEXA SCAN  Completed  . PNA vac Low Risk Adult  Completed    Cancer Screenings: Lung: Low Dose CT Chest recommended if Age 45-80 years, 30 pack-year currently smoking OR have quit w/in 15 years. Patient does not qualify. Breast:  Up to date on Mammogram? No, mammogram is due, will discuss with provider   Up to date of Bone Density/Dexa: Yes, completed 06/30/2019 Colorectal: FOBT due per patient   Additional Screenings: Hepatitis C Screening: N/A      Plan:   Patient will continue to walk everyday for about 30 minutes to 1 hour.    I have personally reviewed and noted the following in the patient's chart:   . Medical and social history . Use of alcohol, tobacco or illicit drugs  . Current medications and supplements . Functional ability and status . Nutritional status . Physical activity . Advanced directives . List of other physicians . Hospitalizations, surgeries, and ER visits in previous 12 months . Vitals . Screenings to include cognitive, depression, and falls . Referrals and appointments  In addition, I have reviewed and discussed with patient certain preventive protocols, quality metrics, and best practice recommendations. A written personalized care plan for preventive services as well as general preventive health recommendations were provided to patient.     Andrez Grime, LPN  [id number]

## 2020-02-29 NOTE — Patient Instructions (Signed)
[name] , Thank you for taking time to come for your Medicare Wellness Visit. I appreciate your ongoing commitment to your health goals. Please review the following plan we discussed and let me know if I can assist you in the future.   Screening recommendations/referrals: Colonoscopy: FOBT due Mammogram: due Bone Density: Up to date, completed 06/30/2019 Recommended yearly ophthalmology/optometry visit for glaucoma screening and checkup Recommended yearly dental visit for hygiene and checkup  Vaccinations: Influenza vaccine: Up to date, completed 08/06/2019 Pneumococcal vaccine: Completed series Tdap vaccine: Up to date, completed 07/23/2010 Shingles vaccine: Completed series    Advanced directives: Please bring a copy of your POA (Power of Brice) and/or Living Will to your next appointment.   Conditions/risks identified: hyperlipidemia  Next appointment: 03/07/2020 @ 3:20 pm    Preventive Care 65 Years and Older, Female Preventive care refers to lifestyle choices and visits with your health care provider that can promote health and wellness. What does preventive care include?  A yearly physical exam. This is also called an annual well check.  Dental exams once or twice a year.  Routine eye exams. Ask your health care provider how often you should have your eyes checked.  Personal lifestyle choices, including:  Daily care of your teeth and gums.  Regular physical activity.  Eating a healthy diet.  Avoiding tobacco and drug use.  Limiting alcohol use.  Practicing safe sex.  Taking low-dose aspirin every day.  Taking vitamin and mineral supplements as recommended by your health care provider. What happens during an annual well check? The services and screenings done by your health care provider during your annual well check will depend on your age, overall health, lifestyle risk factors, and family history of disease. Counseling  Your health care provider may ask  you questions about your:  Alcohol use.  Tobacco use.  Drug use.  Emotional well-being.  Home and relationship well-being.  Sexual activity.  Eating habits.  History of falls.  Memory and ability to understand (cognition).  Work and work Statistician.  Reproductive health. Screening  You may have the following tests or measurements:  Height, weight, and BMI.  Blood pressure.  Lipid and cholesterol levels. These may be checked every 5 years, or more frequently if you are over 20 years old.  Skin check.  Lung cancer screening. You may have this screening every year starting at age 62 if you have a 30-pack-year history of smoking and currently smoke or have quit within the past 15 years.  Fecal occult blood test (FOBT) of the stool. You may have this test every year starting at age 43.  Flexible sigmoidoscopy or colonoscopy. You may have a sigmoidoscopy every 5 years or a colonoscopy every 10 years starting at age 89.  Hepatitis C blood test.  Hepatitis B blood test.  Sexually transmitted disease (STD) testing.  Diabetes screening. This is done by checking your blood sugar (glucose) after you have not eaten for a while (fasting). You may have this done every 1-3 years.  Bone density scan. This is done to screen for osteoporosis. You may have this done starting at age 60.  Mammogram. This may be done every 1-2 years. Talk to your health care provider about how often you should have regular mammograms. Talk with your health care provider about your test results, treatment options, and if necessary, the need for more tests. Vaccines  Your health care provider may recommend certain vaccines, such as:  Influenza vaccine. This is recommended every  year.  Tetanus, diphtheria, and acellular pertussis (Tdap, Td) vaccine. You may need a Td booster every 10 years.  Zoster vaccine. You may need this after age 37.  Pneumococcal 13-valent conjugate (PCV13) vaccine. One dose  is recommended after age 67.  Pneumococcal polysaccharide (PPSV23) vaccine. One dose is recommended after age 71. Talk to your health care provider about which screenings and vaccines you need and how often you need them. This information is not intended to replace advice given to you by your health care provider. Make sure you discuss any questions you have with your health care provider. Document Released: 12/08/2015 Document Revised: 07/31/2016 Document Reviewed: 09/12/2015 Elsevier Interactive Patient Education  2017 [employer] Prevention in the Home Falls can cause injuries. They can happen to people of all ages. There are many things you can do to make your home safe and to help prevent falls. What can I do on the outside of my home?  Regularly fix the edges of walkways and driveways and fix any cracks.  Remove anything that might make you trip as you walk through a door, such as a raised step or threshold.  Trim any bushes or trees on the path to your home.  Use bright outdoor lighting.  Clear any walking paths of anything that might make someone trip, such as rocks or tools.  Regularly check to see if handrails are loose or broken. Make sure that both sides of any steps have handrails.  Any raised decks and porches should have guardrails on the edges.  Have any leaves, snow, or ice cleared regularly.  Use sand or salt on walking paths during winter.  Clean up any spills in your garage right away. This includes oil or grease spills. What can I do in the bathroom?  Use night lights.  Install grab bars by the toilet and in the tub and shower. Do not use towel bars as grab bars.  Use non-skid mats or decals in the tub or shower.  If you need to sit down in the shower, use a plastic, non-slip stool.  Keep the floor dry. Clean up any water that spills on the floor as soon as it happens.  Remove soap buildup in the tub or shower regularly.  Attach bath mats  securely with double-sided non-slip rug tape.  Do not have throw rugs and other things on the floor that can make you trip. What can I do in the bedroom?  Use night lights.  Make sure that you have a light by your bed that is easy to reach.  Do not use any sheets or blankets that are too big for your bed. They should not hang down onto the floor.  Have a firm chair that has side arms. You can use this for support while you get dressed.  Do not have throw rugs and other things on the floor that can make you trip. What can I do in the kitchen?  Clean up any spills right away.  Avoid walking on wet floors.  Keep items that you use a lot in easy-to-reach places.  If you need to reach something above you, use a strong step stool that has a grab bar.  Keep electrical cords out of the way.  Do not use floor polish or wax that makes floors slippery. If you must use wax, use non-skid floor wax.  Do not have throw rugs and other things on the floor that can make you trip. What can  I do with my stairs?  Do not leave any items on the stairs.  Make sure that there are handrails on both sides of the stairs and use them. Fix handrails that are broken or loose. Make sure that handrails are as long as the stairways.  Check any carpeting to make sure that it is firmly attached to the stairs. Fix any carpet that is loose or worn.  Avoid having throw rugs at the top or bottom of the stairs. If you do have throw rugs, attach them to the floor with carpet tape.  Make sure that you have a light switch at the top of the stairs and the bottom of the stairs. If you do not have them, ask someone to add them for you. What else can I do to help prevent falls?  Wear shoes that:  Do not have high heels.  Have rubber bottoms.  Are comfortable and fit you well.  Are closed at the toe. Do not wear sandals.  If you use a stepladder:  Make sure that it is fully opened. Do not climb a closed  stepladder.  Make sure that both sides of the stepladder are locked into place.  Ask someone to hold it for you, if possible.  Clearly mark and make sure that you can see:  Any grab bars or handrails.  First and last steps.  Where the edge of each step is.  Use tools that help you move around (mobility aids) if they are needed. These include:  Canes.  Walkers.  Scooters.  Crutches.  Turn on the lights when you go into a dark area. Replace any light bulbs as soon as they burn out.  Set up your furniture so you have a clear path. Avoid moving your furniture around.  If any of your floors are uneven, fix them.  If there are any pets around you, be aware of where they are.  Review your medicines with your doctor. Some medicines can make you feel dizzy. This can increase your chance of falling. Ask your doctor what other things that you can do to help prevent falls. This information is not intended to replace advice given to you by your health care provider. Make sure you discuss any questions you have with your health care provider. Document Released: 09/07/2009 Document Revised: 04/18/2016 Document Reviewed: 12/16/2014 Elsevier Interactive Patient Education  2017 [employer].

## 2020-03-05 ENCOUNTER — Other Ambulatory Visit: Payer: Self-pay | Admitting: Primary Care

## 2020-03-05 DIAGNOSIS — M1990 Unspecified osteoarthritis, unspecified site: Secondary | ICD-10-CM

## 2020-03-07 ENCOUNTER — Other Ambulatory Visit: Payer: Self-pay

## 2020-03-07 ENCOUNTER — Encounter: Payer: Self-pay | Admitting: *Deleted

## 2020-03-07 ENCOUNTER — Ambulatory Visit (INDEPENDENT_AMBULATORY_CARE_PROVIDER_SITE_OTHER): Payer: Medicare Other | Admitting: Primary Care

## 2020-03-07 ENCOUNTER — Encounter: Payer: Self-pay | Admitting: Primary Care

## 2020-03-07 VITALS — BP 124/74 | HR 80 | Temp 97.0°F | Ht 61.5 in | Wt 129.0 lb

## 2020-03-07 DIAGNOSIS — H409 Unspecified glaucoma: Secondary | ICD-10-CM

## 2020-03-07 DIAGNOSIS — Z23 Encounter for immunization: Secondary | ICD-10-CM

## 2020-03-07 DIAGNOSIS — M159 Polyosteoarthritis, unspecified: Secondary | ICD-10-CM

## 2020-03-07 DIAGNOSIS — E785 Hyperlipidemia, unspecified: Secondary | ICD-10-CM

## 2020-03-07 DIAGNOSIS — R002 Palpitations: Secondary | ICD-10-CM

## 2020-03-07 MED ORDER — TETANUS-DIPHTH-ACELL PERTUSSIS 5-2-15.5 LF-MCG/0.5 IM SUSP: 0.5000 mL | Freq: Once | INTRAMUSCULAR | 0 refills | Status: AC

## 2020-03-07 NOTE — Assessment & Plan Note (Signed)
Doing well on Melxicam 7.5 mg for which she takes mostly once daily, sometimes twice daily. Continue same.

## 2020-03-07 NOTE — Assessment & Plan Note (Signed)
Well controlled on Xalatan, continue same. Follows with ophthalmology.

## 2020-03-07 NOTE — Assessment & Plan Note (Signed)
LDL of 121 which is an increase from 97 from last year. ASCVD risk score of 19.2%, discussed this with patient today. Recommended low dose statin therapy, she will think about this and update.

## 2020-03-07 NOTE — Patient Instructions (Signed)
Your cholesterol level is too high which can put you at risk for heart disease/stroke. Values above 10% are high risk.   The 10-year ASCVD risk score Mikey Bussing DC Brooke Bonito., et al., 2013) is: 19.2%   Values used to calculate the score:     Age: 79 years     Sex: Female     Is Non-Hispanic African American: No     Diabetic: No     Tobacco smoker: No     Systolic Blood Pressure: A999333 mmHg     Is BP treated: No     HDL Cholesterol: 74 mg/dL     Total Cholesterol: 220 mg/dL   Start exercising. You should be getting 150 minutes of exercise weekly.  Continue to work on a healthy diet. Ensure you are consuming 64 ounces of water daily.  Take the tetanus vaccine to the pharmacy.  It was a pleasure to see you today!

## 2020-03-07 NOTE — Progress Notes (Signed)
Subjective:    Patient ID: Ann Estes, female    DOB: 1941/09/09, 79 y.o.   MRN: [id number]  HPI  This visit occurred during the SARS-CoV-2 public health emergency.  Safety protocols were in place, including screening questions prior to the visit, additional usage of staff PPE, and extensive cleaning of exam room while observing appropriate contact time as indicated for disinfecting solutions.   Ann Estes is a 79 year old female who presents today for [place] Part 2. She spoke with our health advisor last week  Immunizations: -Tetanus: Completed in 2011 -Influenza: Completed last season -Shingles: Completed Shingrix and Zostavax -Pneumonia: Completed Prevnar 2014 and Pneuomovax in 2016  Mammogram: Completed in October 2019, declines repeat mammogram Dexa: Completed in 2020. Compliant to calcium and vitamin D. Colonoscopy: Fecal occult card negative in 2019, declines further testing. Hep C Screen: Negative   BP Readings from Last 3 Encounters:  03/07/20 124/74  07/12/19 124/64  05/03/19 118/70   The 10-year ASCVD risk score Mikey Bussing DC Jr., et al., 2013) is: 19.2%   Values used to calculate the score:     Age: 58 years     Sex: Female     Is Non-Hispanic African American: No     Diabetic: No     Tobacco smoker: No     Systolic Blood Pressure: A999333 mmHg     Is BP treated: No     HDL Cholesterol: 74 mg/dL     Total Cholesterol: 220 mg/dL   Review of Systems  Eyes: Negative for visual disturbance.  Respiratory: Negative for shortness of breath.   Cardiovascular: Negative for chest pain.  Musculoskeletal: Positive for arthralgias.  Neurological: Negative for dizziness and headaches.       Past Medical History:  Diagnosis Date  . BPPV (benign paroxysmal positional vertigo)   . Chicken pox   . Glaucoma   . Hyperlipidemia   . Osteoarthritis   . Palpitations 10/13/2018     Social History   Socioeconomic History  . Marital status: Divorced    Spouse name: Not on file    . Number of children: Not on file  . Years of education: Not on file  . Highest education level: Not on file  Occupational History  . Not on file  Tobacco Use  . Smoking status: Former Research scientist (life sciences)  . Smokeless tobacco: Never Used  Substance and Sexual Activity  . Alcohol use: Yes    Comment: twice a year  . Drug use: No  . Sexual activity: Not on file  Other Topics Concern  . Not on file  Social History Narrative  . Not on file   Social Determinants of Health   Financial Resource Strain: Low Risk   . Difficulty of Paying Living Expenses: Not hard at all  Food Insecurity: No Food Insecurity  . Worried About Charity fundraiser in the Last Year: Never true  . Ran Out of Food in the Last Year: Never true  Transportation Needs: No Transportation Needs  . Lack of Transportation (Medical): No  . Lack of Transportation (Non-Medical): No  Physical Activity: Sufficiently Active  . Days of Exercise per Week: 7 days  . Minutes of Exercise per Session: 50 min  Stress: No Stress Concern Present  . Feeling of Stress : Not at all  Social Connections:   . Frequency of Communication with Friends and Family:   . Frequency of Social Gatherings with Friends and Family:   . Attends Religious Services:   .  Active Member of Clubs or Organizations:   . Attends Archivist Meetings:   Marland Kitchen Marital Status:   Intimate Partner Violence: Not At Risk  . Fear of Current or Ex-Partner: No  . Emotionally Abused: No  . Physically Abused: No  . Sexually Abused: No    Past Surgical History:  Procedure Laterality Date  . AUGMENTATION MAMMAPLASTY Bilateral ?   Impants in and then removed   . BREAST IMPLANT REMOVAL    . CATARACT EXTRACTION Bilateral   . COSMETIC SURGERY     Eyelids, tummy tuck  . PLACEMENT OF BREAST IMPLANTS    . 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 TOTAL HIP ARTHROPLASTY Left 02/2005  . TRIGGER FINGER RELEASE  11/2016   Thumb    Family History  Problem Relation Age of  Onset  . Heart disease Mother   . Cancer Father   . Birth defects Daughter   . Arthritis Son   . Cancer Maternal Grandmother   . Heart attack Maternal Grandfather   . Asthma Sister   . Depression Sister   . Asthma Sister     No Known Allergies  Current Outpatient Medications on File Prior to Visit  Medication Sig Dispense Refill  . Calcium Carbonate-Vitamin D3 (CALCIUM 600-D) 600-400 MG-UNIT TABS     . Green Tea, Camillia sinensis, (GREEN TEA PO) Take by mouth.     . latanoprost (XALATAN) 0.005 % ophthalmic solution INSTILL 1 DROP INTO BOTH EYES EVERY DAY  2  . meloxicam (MOBIC) 7.5 MG tablet TAKE 1 TABLET (7.5 MG TOTAL) BY MOUTH 2 (TWO) TIMES DAILY AS NEEDED FOR PAIN. 60 tablet 0  . metoprolol succinate (TOPROL-XL) 25 MG 24 hr tablet Take 1 tablet (25 mg total) by mouth daily. 90 tablet 3   No current facility-administered medications on file prior to visit.    BP 124/74   Pulse 80   Temp (!) 97 F (36.1 C) (Temporal)   Ht 5' 1.5" (1.562 m)   Wt 129 lb (58.5 kg)   SpO2 98%   BMI 23.98 kg/m    Objective:   Physical Exam  Constitutional: She appears well-nourished.  Cardiovascular: Normal rate and regular rhythm.  Respiratory: Effort normal and breath sounds normal.  Musculoskeletal:     Cervical back: Neck supple.  Skin: Skin is warm and dry.  Psychiatric: She has a normal mood and affect.           Assessment & Plan:

## 2020-03-07 NOTE — Assessment & Plan Note (Signed)
Well controlled on metoprolol succinate daily, continue same.

## 2020-03-10 ENCOUNTER — Telehealth: Payer: Self-pay

## 2020-03-10 ENCOUNTER — Emergency Department (HOSPITAL_COMMUNITY): Payer: Medicare Other

## 2020-03-10 ENCOUNTER — Encounter (HOSPITAL_COMMUNITY): Payer: Self-pay | Admitting: Emergency Medicine

## 2020-03-10 ENCOUNTER — Inpatient Hospital Stay (HOSPITAL_COMMUNITY)
Admission: EM | Admit: 2020-03-10 | Discharge: 2020-03-12 | DRG: 419 | Disposition: A | Discharge status: Home / Self Care | Payer: Medicare Other | Attending: Surgery | Admitting: Surgery

## 2020-03-10 ENCOUNTER — Other Ambulatory Visit: Payer: Self-pay

## 2020-03-10 DIAGNOSIS — Z8249 Family history of ischemic heart disease and other diseases of the circulatory system: Secondary | ICD-10-CM

## 2020-03-10 DIAGNOSIS — Z20822 Contact with and (suspected) exposure to covid-19: Secondary | ICD-10-CM | POA: Diagnosis present

## 2020-03-10 DIAGNOSIS — Z96643 Presence of artificial hip joint, bilateral: Secondary | ICD-10-CM | POA: Diagnosis present

## 2020-03-10 DIAGNOSIS — K660 Peritoneal adhesions (postprocedural) (postinfection): Secondary | ICD-10-CM | POA: Diagnosis present

## 2020-03-10 DIAGNOSIS — R1110 Vomiting, unspecified: Secondary | ICD-10-CM | POA: Diagnosis not present

## 2020-03-10 DIAGNOSIS — Z87891 Personal history of nicotine dependence: Secondary | ICD-10-CM

## 2020-03-10 DIAGNOSIS — E860 Dehydration: Secondary | ICD-10-CM | POA: Diagnosis present

## 2020-03-10 DIAGNOSIS — K810 Acute cholecystitis: Secondary | ICD-10-CM | POA: Diagnosis not present

## 2020-03-10 DIAGNOSIS — Z79899 Other long term (current) drug therapy: Secondary | ICD-10-CM

## 2020-03-10 DIAGNOSIS — Z825 Family history of asthma and other chronic lower respiratory diseases: Secondary | ICD-10-CM

## 2020-03-10 DIAGNOSIS — R5383 Other fatigue: Secondary | ICD-10-CM | POA: Diagnosis not present

## 2020-03-10 DIAGNOSIS — Z818 Family history of other mental and behavioral disorders: Secondary | ICD-10-CM

## 2020-03-10 DIAGNOSIS — E785 Hyperlipidemia, unspecified: Secondary | ICD-10-CM | POA: Diagnosis not present

## 2020-03-10 DIAGNOSIS — K8000 Calculus of gallbladder with acute cholecystitis without obstruction: Secondary | ICD-10-CM

## 2020-03-10 HISTORY — DX: Calculus of gallbladder with acute cholecystitis without obstruction: K80.00

## 2020-03-10 LAB — CBC
HCT: 42.2 % (ref 36.0–46.0)
HCT: 40.3 % (ref 36.0–46.0)
Hemoglobin: 13.5 g/dL (ref 12.0–15.0)
Hemoglobin: 13.1 g/dL (ref 12.0–15.0)
MCH: 29.7 pg (ref 26.0–34.0)
MCH: 29.8 pg (ref 26.0–34.0)
MCHC: 32 g/dL (ref 30.0–36.0)
MCHC: 32.5 g/dL (ref 30.0–36.0)
MCV: 92.7 fL (ref 80.0–100.0)
MCV: 91.6 fL (ref 80.0–100.0)
Platelets: 211 10*3/uL (ref 150–400)
Platelets: 200 10*3/uL (ref 150–400)
RBC: 4.55 MIL/uL (ref 3.87–5.11)
RBC: 4.4 MIL/uL (ref 3.87–5.11)
RDW: 12.3 % (ref 11.5–15.5)
RDW: 12.4 % (ref 11.5–15.5)
WBC: 17.1 10*3/uL — ABNORMAL HIGH (ref 4.0–10.5)
WBC: 13.9 10*3/uL — ABNORMAL HIGH (ref 4.0–10.5)
nRBC: 0 % (ref 0.0–0.2)
nRBC: 0 % (ref 0.0–0.2)

## 2020-03-10 LAB — COMPREHENSIVE METABOLIC PANEL
ALT: 14 U/L (ref 0–44)
AST: 22 U/L (ref 15–41)
Albumin: 3.6 g/dL (ref 3.5–5.0)
Alkaline Phosphatase: 55 U/L (ref 38–126)
Anion gap: 12 (ref 5–15)
BUN: 15 mg/dL (ref 8–23)
CO2: 22 mmol/L (ref 22–32)
Calcium: 9 mg/dL (ref 8.9–10.3)
Chloride: 95 mmol/L — ABNORMAL LOW (ref 98–111)
Creatinine, Ser: 0.67 mg/dL (ref 0.44–1.00)
GFR calc Af Amer: >60 mL/min (ref >60)
GFR calc non Af Amer: >60 mL/min (ref >60)
Glucose, Bld: 134 mg/dL — ABNORMAL HIGH (ref 70–99)
Potassium: 3.7 mmol/L (ref 3.5–5.1)
Sodium: 129 mmol/L — ABNORMAL LOW (ref 135–145)
Total Bilirubin: 2.2 mg/dL — ABNORMAL HIGH (ref 0.3–1.2)
Total Protein: 6.7 g/dL (ref 6.5–8.1)

## 2020-03-10 LAB — URINALYSIS, ROUTINE W REFLEX MICROSCOPIC
Bacteria, UA: NONE SEEN
Bilirubin Urine: NEGATIVE
Glucose, UA: NEGATIVE mg/dL
Hgb urine dipstick: NEGATIVE
Ketones, ur: 20 mg/dL — AB
Leukocytes,Ua: NEGATIVE
Nitrite: NEGATIVE
Protein, ur: 100 mg/dL — AB
Specific Gravity, Urine: 1.026 (ref 1.005–1.030)
pH: 5 (ref 5.0–8.0)

## 2020-03-10 LAB — SURGICAL PCR SCREEN
MRSA, PCR: NEGATIVE
Staphylococcus aureus: NEGATIVE

## 2020-03-10 LAB — CREATININE, SERUM
Creatinine, Ser: 0.76 mg/dL (ref 0.44–1.00)
GFR calc Af Amer: >60 mL/min (ref >60)
GFR calc non Af Amer: >60 mL/min (ref >60)

## 2020-03-10 LAB — SARS CORONAVIRUS 2 (TAT 6-24 HRS): SARS Coronavirus 2: NEGATIVE

## 2020-03-10 LAB — LIPASE, BLOOD: Lipase: 19 U/L (ref 11–51)

## 2020-03-10 MED ORDER — DIPHENHYDRAMINE HCL 50 MG/ML IJ SOLN: 12.5000 mg | Freq: Four times a day (QID) | INTRAMUSCULAR | Status: DC | PRN

## 2020-03-10 MED ORDER — POTASSIUM CHLORIDE IN NACL 20-0.9 MEQ/L-% IV SOLN
INTRAVENOUS | Status: DC
  Filled 2020-03-10 (×5): qty 1000

## 2020-03-10 MED ORDER — SODIUM CHLORIDE 0.9 % IV BOLUS
500.0000 mL | Freq: Once | INTRAVENOUS | Status: AC
  Administered 2020-03-10: 500 mL via INTRAVENOUS

## 2020-03-10 MED ORDER — ONDANSETRON 4 MG PO TBDP: 4.0000 mg | ORAL_TABLET | Freq: Four times a day (QID) | ORAL | Status: DC | PRN

## 2020-03-10 MED ORDER — ENOXAPARIN SODIUM 40 MG/0.4ML ~~LOC~~ SOLN
40.0000 mg | SUBCUTANEOUS | Status: DC
  Administered 2020-03-11: 12:00:00 40 mg via SUBCUTANEOUS
  Filled 2020-03-10: qty 0.4

## 2020-03-10 MED ORDER — ONDANSETRON HCL 4 MG/2ML IJ SOLN
4.0000 mg | Freq: Once | INTRAMUSCULAR | Status: AC
  Administered 2020-03-10: 15:00:00 4 mg via INTRAVENOUS
  Filled 2020-03-10: qty 2

## 2020-03-10 MED ORDER — METOPROLOL TARTRATE 5 MG/5ML IV SOLN: 5.0000 mg | Freq: Four times a day (QID) | INTRAVENOUS | Status: DC | PRN

## 2020-03-10 MED ORDER — DIPHENHYDRAMINE HCL 12.5 MG/5ML PO ELIX: 12.5000 mg | ORAL_SOLUTION | Freq: Four times a day (QID) | ORAL | Status: DC | PRN

## 2020-03-10 MED ORDER — METOPROLOL SUCCINATE ER 25 MG PO TB24
25.0000 mg | ORAL_TABLET | Freq: Every day | ORAL | Status: DC
  Administered 2020-03-11 – 2020-03-12 (×2): 25 mg via ORAL
  Filled 2020-03-10 (×3): qty 1

## 2020-03-10 MED ORDER — MORPHINE SULFATE (PF) 2 MG/ML IV SOLN
2.0000 mg | INTRAVENOUS | Status: DC | PRN
  Administered 2020-03-10 – 2020-03-11 (×3): 2 mg via INTRAVENOUS
  Filled 2020-03-10 (×3): qty 1

## 2020-03-10 MED ORDER — SODIUM CHLORIDE 0.9 % IV SOLN
2.0000 g | INTRAVENOUS | Status: DC
  Administered 2020-03-10 – 2020-03-12 (×2): 2 g via INTRAVENOUS
  Filled 2020-03-10: qty 20
  Filled 2020-03-10 (×2): qty 2
  Filled 2020-03-10 (×2): qty 20

## 2020-03-10 MED ORDER — FENTANYL CITRATE (PF) 100 MCG/2ML IJ SOLN
50.0000 ug | Freq: Once | INTRAMUSCULAR | Status: AC
  Administered 2020-03-10: 15:00:00 50 ug via INTRAVENOUS
  Filled 2020-03-10: qty 2

## 2020-03-10 MED ORDER — IOHEXOL 300 MG/ML  SOLN
100.0000 mL | Freq: Once | INTRAMUSCULAR | Status: AC | PRN
  Administered 2020-03-10: 16:00:00 100 mL via INTRAVENOUS

## 2020-03-10 MED ORDER — ACETAMINOPHEN 500 MG PO TABS
1000.0000 mg | ORAL_TABLET | ORAL | Status: AC
  Administered 2020-03-11: 06:00:00 1000 mg via ORAL
  Filled 2020-03-10: qty 2

## 2020-03-10 MED ORDER — PIPERACILLIN-TAZOBACTAM 3.375 G IVPB 30 MIN
3.3750 g | Freq: Once | INTRAVENOUS | Status: AC
  Administered 2020-03-10: 3.375 g via INTRAVENOUS
  Filled 2020-03-10: qty 50

## 2020-03-10 MED ORDER — LATANOPROST 0.005 % OP SOLN
1.0000 [drp] | Freq: Every day | OPHTHALMIC | Status: DC
  Administered 2020-03-10 – 2020-03-11 (×2): 1 [drp] via OPHTHALMIC
  Filled 2020-03-10: qty 2.5

## 2020-03-10 MED ORDER — GABAPENTIN 300 MG PO CAPS
300.0000 mg | ORAL_CAPSULE | ORAL | Status: AC
  Administered 2020-03-11: 06:00:00 300 mg via ORAL
  Filled 2020-03-10: qty 1

## 2020-03-10 MED ORDER — ONDANSETRON 4 MG PO TBDP
4.0000 mg | ORAL_TABLET | Freq: Once | ORAL | Status: AC | PRN
  Administered 2020-03-10: 11:00:00 4 mg via ORAL
  Filled 2020-03-10: qty 1

## 2020-03-10 MED ORDER — ONDANSETRON HCL 4 MG/2ML IJ SOLN: 4.0000 mg | Freq: Four times a day (QID) | INTRAMUSCULAR | Status: DC | PRN

## 2020-03-10 MED ORDER — MORPHINE SULFATE (PF) 4 MG/ML IV SOLN
4.0000 mg | INTRAVENOUS | Status: DC | PRN
  Administered 2020-03-10: 4 mg via INTRAVENOUS
  Filled 2020-03-10: qty 1

## 2020-03-10 NOTE — H&P (Signed)
Ann Estes is an 79 y.o. female.   Chief Complaint:   RUQ pain, nausea, vomiting HPI: This is a 79 year old female in relatively good health who presents with 2 days of right-sided abdominal pain, nausea, vomiting.  No diarrhea.  Poor PO intake for the last two days.  She presented to the ED for evaluation and was found to have acute cholecystitis.  Past Medical History:  Diagnosis Date  . BPPV (benign paroxysmal positional vertigo)   . Chicken pox   . Glaucoma   . Hyperlipidemia   . Osteoarthritis   . Palpitations 10/13/2018    Past Surgical History:  Procedure Laterality Date  . AUGMENTATION MAMMAPLASTY Bilateral ?   Impants in and then removed   . BREAST IMPLANT REMOVAL    . CATARACT EXTRACTION Bilateral   . COSMETIC SURGERY     Eyelids, tummy tuck  . PLACEMENT OF BREAST IMPLANTS    . 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 TOTAL HIP ARTHROPLASTY Left 02/2005  . TRIGGER FINGER RELEASE  11/2016   Thumb    Family History  Problem Relation Age of Onset  . Heart disease Mother   . Cancer Father   . Birth defects Daughter   . Arthritis Son   . Cancer Maternal Grandmother   . Heart attack Maternal Grandfather   . Asthma Sister   . Depression Sister   . Asthma Sister    Social History:  reports that she has quit smoking. She has never used smokeless tobacco. She reports current alcohol use. She reports that she does not use drugs.  Allergies: No Known Allergies  Prior to Admission medications   Medication Sig Start Date End Date Taking? Authorizing Provider  Calcium Carbonate-Vitamin D3 (CALCIUM 600-D) 600-400 MG-UNIT TABS Take 1 tablet by mouth 2 (two) times daily.  07/05/19  Yes [provider]  Eloise Levels, Camillia sinensis, (GREEN TEA PO) Take by mouth.    Yes [provider]  latanoprost (XALATAN) 0.005 % ophthalmic solution Place 1 drop into both eyes at bedtime.  05/26/17  Yes [provider]  meloxicam (MOBIC) 7.5 MG tablet TAKE 1 TABLET  (7.5 MG TOTAL) BY MOUTH 2 (TWO) TIMES DAILY AS NEEDED FOR PAIN. Patient taking differently: Take 7.5 mg by mouth 2 (two) times daily as needed for pain.  03/07/20  Yes Pleas Koch, NP  metoprolol succinate (TOPROL-XL) 25 MG 24 hr tablet Take 1 tablet (25 mg total) by mouth daily. 04/26/19  Yes Belva Crome, MD     Results for orders placed or performed during the hospital encounter of 03/10/20 (from the past 48 hour(s))  Lipase, blood     Status: None   Collection Time: 03/10/20 10:54 AM  Result Value Ref Range   Lipase 19 11 - 51 U/L    Comment: Performed at [facility], [place] [address] [place], [place] [address]  Comprehensive metabolic panel     Status: Abnormal   Collection Time: 03/10/20 10:54 AM  Result Value Ref Range   Sodium 129 (L) 135 - 145 mmol/L   Potassium 3.7 3.5 - 5.1 mmol/L   Chloride 95 (L) 98 - 111 mmol/L   CO2 22 22 - 32 mmol/L   Glucose, Bld 134 (H) 70 - 99 mg/dL    Comment: Glucose reference range applies only to samples taken after fasting for at least 8 hours.   BUN 15 8 - 23 mg/dL   Creatinine, Ser 0.67 0.44 - 1.00  mg/dL   Calcium 9.0 8.9 - 10.3 mg/dL   Total Protein 6.7 6.5 - 8.1 g/dL   Albumin 3.6 3.5 - 5.0 g/dL   AST 22 15 - 41 U/L   ALT 14 0 - 44 U/L   Alkaline Phosphatase 55 38 - 126 U/L   Total Bilirubin 2.2 (H) 0.3 - 1.2 mg/dL   GFR calc non Af Amer >60 >60 mL/min   GFR calc Af Amer >60 >60 mL/min   Anion gap 12 5 - 15    Comment: Performed at [facility] [address], [place], [place] [address]  CBC     Status: Abnormal   Collection Time: 03/10/20 10:54 AM  Result Value Ref Range   WBC 17.1 (H) 4.0 - 10.5 K/uL   RBC 4.55 3.87 - 5.11 MIL/uL   Hemoglobin 13.5 12.0 - 15.0 g/dL   HCT 42.2 36.0 - 46.0 %   MCV 92.7 80.0 - 100.0 fL   MCH 29.7 26.0 - 34.0 pg   MCHC 32.0 30.0 - 36.0 g/dL   RDW 12.3 11.5 - 15.5 %   Platelets 211 150 - 400 K/uL   nRBC 0.0 0.0 - 0.2 %    Comment: Performed at [facility], [place]  [address], [place], [place] [address]  Urinalysis, Routine w reflex microscopic     Status: Abnormal   Collection Time: 03/10/20  3:19 PM  Result Value Ref Range   Color, Urine AMBER (A) YELLOW    Comment: BIOCHEMICALS MAY BE AFFECTED BY COLOR   APPearance HAZY (A) CLEAR   Specific Gravity, Urine 1.026 1.005 - 1.030   pH 5.0 5.0 - 8.0   Glucose, UA NEGATIVE NEGATIVE mg/dL   Hgb urine dipstick NEGATIVE NEGATIVE   Bilirubin Urine NEGATIVE NEGATIVE   Ketones, ur 20 (A) NEGATIVE mg/dL   Protein, ur 100 (A) NEGATIVE mg/dL   Nitrite NEGATIVE NEGATIVE   Leukocytes,Ua NEGATIVE NEGATIVE   RBC / HPF 0-5 0 - 5 RBC/hpf   WBC, UA 0-5 0 - 5 WBC/hpf   Bacteria, UA NONE SEEN NONE SEEN   Squamous Epithelial / LPF 0-5 0 - 5   Mucus PRESENT     Comment: Performed at [facility], [place] [address], [place], [place] [address]   CT Abdomen Pelvis W Contrast  Result Date: 03/10/2020 CLINICAL DATA:  Right-sided abdominal pain. Nausea. Vomiting. EXAM: CT ABDOMEN AND PELVIS WITH CONTRAST TECHNIQUE: Multidetector CT imaging of the abdomen and pelvis was performed using the standard protocol following bolus administration of intravenous contrast. CONTRAST:  190mL OMNIPAQUE IOHEXOL 300 MG/ML  SOLN COMPARISON:  None. FINDINGS: Lower chest: Mild elevation of right hemidiaphragm with adjacent atelectasis or scarring in the lower lobe. Heart is normal in size. Hepatobiliary: Marked gallbladder distension with wall thickening, pericholecystic edema and fluid, possible small layering gallstones. Inflammatory changes track into the right pericolic gutter with small amount of free fluid. Common bile duct is normal in caliber measuring 5 mm. Mild central intrahepatic biliary ductal dilatation. No focal hepatic lesion. Pancreas: No ductal dilatation or inflammation. No evidence of focal pancreatic lesion. Spleen: Normal in size without focal abnormality. Adrenals/Urinary Tract: No adrenal nodule. No hydronephrosis or perinephric  edema. Homogeneous renal enhancement with symmetric excretion on delayed phase imaging. Simple cyst in the posterior left kidney measures 17 mm. Small cortical hypodensity in the right kidney is too small to characterize. Urinary bladder is nondistended and not well evaluated. Streak artifact from bilateral hip arthroplasties partially  obscures evaluation. Stomach/Bowel: Bowel evaluation is limited in the absence of enteric contrast and paucity of intra-abdominal fat. The stomach is nondistended. Mild duodenal wall thickening is likely reactive related to adjacent gallbladder inflammation. Small suspected duodenal diverticulum. There is scattered fluid-filled nondilated loops of small bowel in the upper and lower abdomen. Cecum is located in the deep pelvic midline. Fluid/liquid colon in the cecum and ascending colon. Transverse colon is tortuous. Diverticulosis in the descending and sigmoid colon without evidence of diverticulitis. The appendix is not confidently visualized, cecum located in the pelvis is partially obscured by streak artifact from bilateral hip arthroplasties. No obvious pericecal inflammation. Vascular/Lymphatic: Moderate to advanced aorta bi-iliac atherosclerosis. No aortic aneurysm. The portal vein is patent. Mesenteric vessels are grossly patent. Prominent periuterine and adnexal vascularity. Dilatation of the left ovarian vein at 9 mm. No bulky abdominopelvic adenopathy, evaluation for adenopathy limited by lack of IV contrast and paucity of intra-abdominal fat. Reproductive: Prominent periuterine and adnexal vascularity, left greater than right. With dilatation of the ovarian veins. No evidence of adnexal mass. Uterus appears atrophic, partially obscured by streak artifact from adjacent hip arthroplasties. Other: Inflammatory change in the right pericolic gutter with fat stranding and small amount of free fluid. Small amount of free fluid tracks into the pelvis. No organized focal fluid  collection. Musculoskeletal: Bilateral hip arthroplasties. There are lobulated lucencies adjacent to both acetabular cups, left greater than right, with well-defined borders. Large left hip joint effusion which appears multilobulated/complex. Multilevel degenerative change in the lumbar spine. IMPRESSION: 1. Findings consistent with acute cholecystitis. Pericholecystic inflammatory changes track into the right pericolic gutter with small amount of free fluid which tracks into the pelvis. No common bile duct dilatation. 2. Colonic diverticulosis without diverticulitis. 3. 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 4. Bilateral hip arthroplasties. Lobulated lucencies adjacent to both acetabular cups, left greater than right, with large left hip joint effusion. 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 Aortic Atherosclerosis (ICD10-I70.0). Electronically Signed   By: Keith Rake M.D.   On: 03/10/2020 16:28    Review of Systems  HENT: Negative for ear discharge, ear pain, hearing loss and tinnitus.   Eyes: Negative for photophobia and pain.  Respiratory: Negative for cough and shortness of breath.   Cardiovascular: Negative for chest pain.  Gastrointestinal: Positive for abdominal pain, nausea and vomiting.  Genitourinary: Negative for dysuria, flank pain, frequency and urgency.  Musculoskeletal: Negative for back pain, myalgias and neck pain.  Neurological: Negative for dizziness and headaches.  Hematological: Does not bruise/bleed easily.  Psychiatric/Behavioral: The patient is not nervous/anxious.     Blood pressure (!) 125/49, pulse 87, temperature 97.6 F (36.4 C), temperature source Oral, resp. rate 18, height 5\' 2"  (1.575 m), weight 58.5 kg, SpO2 97 %. Physical Exam  Constitutional:  WDWN in NAD, conversant, no obvious deformities; lying in bed  comfortably Eyes:  Pupils equal, round; sclera anicteric; moist conjunctiva; no lid lag HENT:  Oral mucosa moist; good dentition  Neck:  No masses palpated, trachea midline; no thyromegaly Lungs:  CTA bilaterally; normal respiratory effort CV:  Regular rate and rhythm; no murmurs; extremities well-perfused with no edema Abd:  +bowel sounds, soft, tender in RUQ, no palpable organomegaly; no palpable hernias; previous abdominoplasty scar Musc:  Unable to assess gait; no apparent clubbing or cyanosis in extremities Lymphatic:  No palpable cervical or axillary lymphadenopathy Skin:  Warm, dry; no  sign of jaundice Psychiatric - alert and oriented x 4; calm mood and affect  Assessment/Plan Acute calculus cholecystitis - mildly elevated T. Bili, but clinically dehydrated  IV hydration tonight IV antibiotics Plan lap chole tomorrow by Dr. Kae Heller.     Maia Petties, MD 03/10/2020, 6:12 PM

## 2020-03-10 NOTE — ED Provider Notes (Signed)
[facility] EMERGENCY DEPARTMENT Provider Note   CSN: [id number] Arrival date & time: 03/10/20  1020     History Chief Complaint  Patient presents with  . Abdominal Pain    Ann Estes is a 79 y.o. female.  The history is provided by the patient and medical records. No language interpreter was used.  Abdominal Pain  Ann Estes is a 79 y.o. female who presents to the Emergency Department complaining of abdominal pain. She presents the emergency department complaining of profound fatigue and right side abdominal pain that began yesterday. Initially her pain was mild in nature but is gradually progressed over the last 24 hours. It is located in the right hemi abdomen and right lower quadrant. She has associated nausea, vomiting. She has food aversion. She denies any fevers. No diarrhea, constipation, dysuria. No prior similar symptoms. She did have her Tdap updated two days ago. She has a history of abdominoplasty, no additional abdominal surgeries. Pain is worse with movement and positioning. It is nonradiating.    Past Medical History:  Diagnosis Date  . BPPV (benign paroxysmal positional vertigo)   . Chicken pox   . Glaucoma   . Hyperlipidemia   . Osteoarthritis   . Palpitations 10/13/2018    Patient Active Problem List   Diagnosis Date Noted  . ECG abnormality 03/31/2019  . Medicare annual wellness visit, subsequent 02/15/2019  . Palpitations 10/13/2018  . Glaucoma 08/11/2017  . Hyperlipidemia 08/11/2017  . Osteoarthritis 08/11/2017    Past Surgical History:  Procedure Laterality Date  . AUGMENTATION MAMMAPLASTY Bilateral ?   Impants in and then removed   . BREAST IMPLANT REMOVAL    . CATARACT EXTRACTION Bilateral   . COSMETIC SURGERY     Eyelids, tummy tuck  . PLACEMENT OF BREAST IMPLANTS    . 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 TOTAL HIP ARTHROPLASTY Left 02/2005  . TRIGGER FINGER RELEASE  11/2016   Thumb     OB History   No  obstetric history on file.     Family History  Problem Relation Age of Onset  . Heart disease Mother   . Cancer Father   . Birth defects Daughter   . Arthritis Son   . Cancer Maternal Grandmother   . Heart attack Maternal Grandfather   . Asthma Sister   . Depression Sister   . Asthma Sister     Social History   Tobacco Use  . Smoking status: Former Research scientist (life sciences)  . Smokeless tobacco: Never Used  Substance Use Topics  . Alcohol use: Yes    Comment: twice a year  . Drug use: No    Home Medications Prior to Admission medications   Medication Sig Start Date End Date Taking? Authorizing Provider  Calcium Carbonate-Vitamin D3 (CALCIUM 600-D) 600-400 MG-UNIT TABS Take 1 tablet by mouth 2 (two) times daily.  07/05/19  Yes [provider]  Eloise Levels, Camillia sinensis, (GREEN TEA PO) Take by mouth.    Yes [provider]  latanoprost (XALATAN) 0.005 % ophthalmic solution Place 1 drop into both eyes at bedtime.  05/26/17  Yes [provider]  meloxicam (MOBIC) 7.5 MG tablet TAKE 1 TABLET (7.5 MG TOTAL) BY MOUTH 2 (TWO) TIMES DAILY AS NEEDED FOR PAIN. Patient taking differently: Take 7.5 mg by mouth 2 (two) times daily as needed for pain.  03/07/20  Yes Pleas Koch, NP  metoprolol succinate (TOPROL-XL) 25 MG 24 hr tablet Take 1 tablet (25 mg total) by  mouth daily. 04/26/19  Yes Belva Crome, MD    Allergies    Patient has no known allergies.  Review of Systems   Review of Systems  Gastrointestinal: Positive for abdominal pain.  All other systems reviewed and are negative.   Physical Exam Updated Vital Signs BP 126/62   Pulse 85   Temp 97.6 F (36.4 C) (Oral)   Resp 18   Ht 5\' 2"  (1.575 m)   Wt 58.5 kg   SpO2 98%   BMI 23.59 kg/m   Physical Exam Vitals and nursing note reviewed.  Constitutional:      Appearance: She is well-developed.  HENT:     Head: Normocephalic and atraumatic.  Cardiovascular:     Rate and Rhythm: Normal rate and  regular rhythm.     Heart sounds: No murmur.  Pulmonary:     Effort: Pulmonary effort is normal. No respiratory distress.     Breath sounds: Normal breath sounds.  Abdominal:     Palpations: Abdomen is soft.     Tenderness: There is no guarding or rebound.     Comments: Mild tenderness in the right hemi abdomen and right lower quadrant.  Musculoskeletal:        General: No tenderness.  Skin:    General: Skin is warm and dry.  Neurological:     Mental Status: She is alert and oriented to person, place, and time.  Psychiatric:        Behavior: Behavior normal.     ED Results / Procedures / Treatments   Labs (all labs ordered are listed, but only abnormal results are displayed) Labs Reviewed  COMPREHENSIVE METABOLIC PANEL - Abnormal; Notable for the following components:      Result Value   Sodium 129 (*)    Chloride 95 (*)    Glucose, Bld 134 (*)    Total Bilirubin 2.2 (*)    All other components within normal limits  CBC - Abnormal; Notable for the following components:   WBC 17.1 (*)    All other components within normal limits  URINALYSIS, ROUTINE W REFLEX MICROSCOPIC - Abnormal; Notable for the following components:   Color, Urine AMBER (*)    APPearance HAZY (*)    Ketones, ur 20 (*)    Protein, ur 100 (*)    All other components within normal limits  LIPASE, BLOOD    EKG None  Radiology No results found.  Procedures Procedures (including critical care time)  Medications Ordered in ED Medications  piperacillin-tazobactam (ZOSYN) IVPB 3.375 g (has no administration in time range)  ondansetron (ZOFRAN-ODT) disintegrating tablet 4 mg (4 mg Oral Given 03/10/20 1050)  sodium chloride 0.9 % bolus 500 mL (500 mLs Intravenous New Bag/Given 03/10/20 1457)  fentaNYL (SUBLIMAZE) injection 50 mcg (50 mcg Intravenous Given 03/10/20 1458)  ondansetron (ZOFRAN) injection 4 mg (4 mg Intravenous Given 03/10/20 1458)  iohexol (OMNIPAQUE) 300 MG/ML solution 100 mL (100 mLs  Intravenous Contrast Given 03/10/20 1557)    ED Course  I have reviewed the triage vital signs and the nursing notes.  Pertinent labs & imaging results that were available during my care of the patient were reviewed by me and considered in my medical decision making (see chart for details).    MDM Rules/Calculators/A&P                     Patient here for evaluation of progressive right-sided abdominal pain with nausea. She has tenderness on examination without  peritoneal findings. Labs are significant for leukocytosis. Patient care transferred pending CT abdomen pelvis to further evaluate her pain. Current presentation is not consistent with sepsis. Patient without any evidence of tachycardia, fever. Will start antibiotics due to concern for possible intra-abdominal process pending further workup.  Final Clinical Impression(s) / ED Diagnoses Final diagnoses:  None    Rx / DC Orders ED Discharge Orders    None       Quintella Reichert, MD 03/10/20 1616

## 2020-03-10 NOTE — ED Triage Notes (Signed)
Patient c/o lower right quadrant abdominal pain with some nausea and vomiting. Pain increasingly worse today.

## 2020-03-10 NOTE — Telephone Encounter (Signed)
Starting on 03/09/20 pt got a tdap and thought that might have caused abd pain. Today more tender right side of abdomen near naval; the pain is dull and continuous, pain level is 5. When presses on abd hurts worse. Pt has appendix; N&V; last time vomited last night; has not eaten since then. No diarrhea, decreased appetite. Chills but no fever. Pt is fatigued and only walked dog short distance this morning due to making rt side hurt worse. Pt declined 911 and pt will have son take her to [facility] ED now. FYI to Gentry Fitz NP.

## 2020-03-10 NOTE — Telephone Encounter (Signed)
Noted. Patient at [facility] ED now.

## 2020-03-10 NOTE — ED Provider Notes (Addendum)
  Physical Exam  BP 126/62   Pulse 85   Temp 97.6 F (36.4 C) (Oral)   Resp 18   Ht 5\' 2"  (1.575 m)   Wt 58.5 kg   SpO2 98%   BMI 23.59 kg/m   Physical Exam  ED Course/Procedures     Procedures  MDM    Assuming care of patient from Dr. Ralene Bathe.   Patient in the ED for abd pain, R sided, mid abd. Workup thus far shows elevated WC. Ct scan ordered.  Concerning findings are as following : none. Important pending results are : none.  According to Dr. Ralene Bathe, plan is to f/u on CT scan.   Patient had no complains, no concerns from the nursing side. Will continue to monitor.  Clinically not septic.   Varney Biles, MD 03/10/20 1550   4:32 PM CT scan shows cholecystitis.  Dr. Ralene Bathe is already ordered Zosyn.  Surgery consulted.  Patient notified about the findings.   Varney Biles, MD 03/10/20 [phone]

## 2020-03-11 ENCOUNTER — Encounter (HOSPITAL_COMMUNITY): Admission: EM | Disposition: A | Discharge status: Home / Self Care | Payer: Self-pay | Source: Home / Self Care

## 2020-03-11 ENCOUNTER — Observation Stay (HOSPITAL_COMMUNITY): Payer: Medicare Other | Admitting: Anesthesiology

## 2020-03-11 DIAGNOSIS — Z825 Family history of asthma and other chronic lower respiratory diseases: Secondary | ICD-10-CM | POA: Diagnosis not present

## 2020-03-11 DIAGNOSIS — K810 Acute cholecystitis: Secondary | ICD-10-CM | POA: Diagnosis not present

## 2020-03-11 DIAGNOSIS — E785 Hyperlipidemia, unspecified: Secondary | ICD-10-CM | POA: Diagnosis present

## 2020-03-11 DIAGNOSIS — H409 Unspecified glaucoma: Secondary | ICD-10-CM | POA: Diagnosis not present

## 2020-03-11 DIAGNOSIS — Z20822 Contact with and (suspected) exposure to covid-19: Secondary | ICD-10-CM | POA: Diagnosis present

## 2020-03-11 DIAGNOSIS — M1990 Unspecified osteoarthritis, unspecified site: Secondary | ICD-10-CM | POA: Diagnosis not present

## 2020-03-11 DIAGNOSIS — Z818 Family history of other mental and behavioral disorders: Secondary | ICD-10-CM | POA: Diagnosis not present

## 2020-03-11 DIAGNOSIS — K8000 Calculus of gallbladder with acute cholecystitis without obstruction: Secondary | ICD-10-CM | POA: Diagnosis not present

## 2020-03-11 DIAGNOSIS — Z8249 Family history of ischemic heart disease and other diseases of the circulatory system: Secondary | ICD-10-CM | POA: Diagnosis not present

## 2020-03-11 DIAGNOSIS — Z79899 Other long term (current) drug therapy: Secondary | ICD-10-CM | POA: Diagnosis not present

## 2020-03-11 DIAGNOSIS — E860 Dehydration: Secondary | ICD-10-CM | POA: Diagnosis present

## 2020-03-11 DIAGNOSIS — Z87891 Personal history of nicotine dependence: Secondary | ICD-10-CM | POA: Diagnosis not present

## 2020-03-11 DIAGNOSIS — Z96643 Presence of artificial hip joint, bilateral: Secondary | ICD-10-CM | POA: Diagnosis present

## 2020-03-11 DIAGNOSIS — K8042 Calculus of bile duct with acute cholecystitis without obstruction: Secondary | ICD-10-CM | POA: Diagnosis not present

## 2020-03-11 DIAGNOSIS — K660 Peritoneal adhesions (postprocedural) (postinfection): Secondary | ICD-10-CM | POA: Diagnosis present

## 2020-03-11 HISTORY — PX: CHOLECYSTECTOMY: SHX55

## 2020-03-11 LAB — COMPREHENSIVE METABOLIC PANEL
ALT: 12 U/L (ref 0–44)
AST: 18 U/L (ref 15–41)
Albumin: 2.6 g/dL — ABNORMAL LOW (ref 3.5–5.0)
Alkaline Phosphatase: 52 U/L (ref 38–126)
Anion gap: 7 (ref 5–15)
BUN: 17 mg/dL (ref 8–23)
CO2: 23 mmol/L (ref 22–32)
Calcium: 8.3 mg/dL — ABNORMAL LOW (ref 8.9–10.3)
Chloride: 104 mmol/L (ref 98–111)
Creatinine, Ser: 0.68 mg/dL (ref 0.44–1.00)
GFR calc Af Amer: >60 mL/min (ref >60)
GFR calc non Af Amer: >60 mL/min (ref >60)
Glucose, Bld: 95 mg/dL (ref 70–99)
Potassium: 4.1 mmol/L (ref 3.5–5.1)
Sodium: 134 mmol/L — ABNORMAL LOW (ref 135–145)
Total Bilirubin: 1.3 mg/dL — ABNORMAL HIGH (ref 0.3–1.2)
Total Protein: 5.6 g/dL — ABNORMAL LOW (ref 6.5–8.1)

## 2020-03-11 LAB — CBC
HCT: 38.4 % (ref 36.0–46.0)
Hemoglobin: 12.7 g/dL (ref 12.0–15.0)
MCH: 30.5 pg (ref 26.0–34.0)
MCHC: 33.1 g/dL (ref 30.0–36.0)
MCV: 92.1 fL (ref 80.0–100.0)
Platelets: 194 10*3/uL (ref 150–400)
RBC: 4.17 MIL/uL (ref 3.87–5.11)
RDW: 12.8 % (ref 11.5–15.5)
WBC: 13.6 10*3/uL — ABNORMAL HIGH (ref 4.0–10.5)
nRBC: 0 % (ref 0.0–0.2)

## 2020-03-11 SURGERY — LAPAROSCOPIC CHOLECYSTECTOMY
Anesthesia: General | Site: Abdomen

## 2020-03-11 MED ORDER — SUGAMMADEX SODIUM 200 MG/2ML IV SOLN
INTRAVENOUS | Status: DC | PRN
  Administered 2020-03-11: 200 mg via INTRAVENOUS

## 2020-03-11 MED ORDER — DEXAMETHASONE SODIUM PHOSPHATE 10 MG/ML IJ SOLN
INTRAMUSCULAR | Status: AC
  Filled 2020-03-11: qty 1

## 2020-03-11 MED ORDER — SODIUM CHLORIDE 0.9 % IR SOLN
Status: DC | PRN
  Administered 2020-03-11: 1000 mL

## 2020-03-11 MED ORDER — ONDANSETRON HCL 4 MG/2ML IJ SOLN: 4.0000 mg | Freq: Once | INTRAMUSCULAR | Status: DC | PRN

## 2020-03-11 MED ORDER — HYDROMORPHONE HCL 1 MG/ML IJ SOLN: 0.5000 mg | INTRAMUSCULAR | Status: DC | PRN

## 2020-03-11 MED ORDER — FENTANYL CITRATE (PF) 100 MCG/2ML IJ SOLN: 25.0000 ug | INTRAMUSCULAR | Status: DC | PRN

## 2020-03-11 MED ORDER — ACETAMINOPHEN 325 MG PO TABS
650.0000 mg | ORAL_TABLET | Freq: Four times a day (QID) | ORAL | Status: DC
  Administered 2020-03-11 – 2020-03-12 (×4): 650 mg via ORAL
  Filled 2020-03-11 (×4): qty 2

## 2020-03-11 MED ORDER — LIDOCAINE 2% (20 MG/ML) 5 ML SYRINGE
INTRAMUSCULAR | Status: AC
  Filled 2020-03-11: qty 5

## 2020-03-11 MED ORDER — BUPIVACAINE HCL (PF) 0.25 % IJ SOLN
INTRAMUSCULAR | Status: AC
  Filled 2020-03-11: qty 30

## 2020-03-11 MED ORDER — METRONIDAZOLE IN NACL 5-0.79 MG/ML-% IV SOLN
500.0000 mg | Freq: Three times a day (TID) | INTRAVENOUS | Status: DC
  Administered 2020-03-11 – 2020-03-12 (×3): 500 mg via INTRAVENOUS
  Filled 2020-03-11 (×3): qty 100

## 2020-03-11 MED ORDER — PROPOFOL 10 MG/ML IV BOLUS
INTRAVENOUS | Status: DC | PRN
  Administered 2020-03-11: 100 mg via INTRAVENOUS

## 2020-03-11 MED ORDER — 0.9 % SODIUM CHLORIDE (POUR BTL) OPTIME
TOPICAL | Status: DC | PRN
  Administered 2020-03-11: 1000 mL

## 2020-03-11 MED ORDER — DOCUSATE SODIUM 100 MG PO CAPS
100.0000 mg | ORAL_CAPSULE | Freq: Two times a day (BID) | ORAL | Status: DC
  Administered 2020-03-11 (×2): 100 mg via ORAL
  Filled 2020-03-11 (×3): qty 1

## 2020-03-11 MED ORDER — OXYCODONE HCL 5 MG PO TABS
5.0000 mg | ORAL_TABLET | ORAL | Status: DC | PRN
  Administered 2020-03-11 – 2020-03-12 (×4): 5 mg via ORAL
  Filled 2020-03-11 (×4): qty 1

## 2020-03-11 MED ORDER — HEMOSTATIC AGENTS (NO CHARGE) OPTIME
TOPICAL | Status: DC | PRN
  Administered 2020-03-11: 1 via TOPICAL

## 2020-03-11 MED ORDER — SODIUM CHLORIDE (PF) 0.9 % IJ SOLN
INTRAMUSCULAR | Status: AC
  Filled 2020-03-11: qty 10

## 2020-03-11 MED ORDER — DEXAMETHASONE SODIUM PHOSPHATE 10 MG/ML IJ SOLN
INTRAMUSCULAR | Status: DC | PRN
  Administered 2020-03-11: 10 mg via INTRAVENOUS

## 2020-03-11 MED ORDER — LIDOCAINE 2% (20 MG/ML) 5 ML SYRINGE
INTRAMUSCULAR | Status: DC | PRN
  Administered 2020-03-11: 60 mg via INTRAVENOUS

## 2020-03-11 MED ORDER — CEFAZOLIN SODIUM 1 G IJ SOLR
INTRAMUSCULAR | Status: AC
  Filled 2020-03-11: qty 20

## 2020-03-11 MED ORDER — CEFAZOLIN SODIUM-DEXTROSE 2-3 GM-%(50ML) IV SOLR
INTRAVENOUS | Status: DC | PRN
  Administered 2020-03-11: 2 g via INTRAVENOUS

## 2020-03-11 MED ORDER — LACTATED RINGERS IV SOLN: INTRAVENOUS | Status: DC

## 2020-03-11 MED ORDER — BUPIVACAINE-EPINEPHRINE 0.25% -1:200000 IJ SOLN
INTRAMUSCULAR | Status: DC | PRN
  Administered 2020-03-11: 17 mL

## 2020-03-11 MED ORDER — ONDANSETRON HCL 4 MG/2ML IJ SOLN
INTRAMUSCULAR | Status: AC
  Filled 2020-03-11: qty 2

## 2020-03-11 MED ORDER — PROPOFOL 10 MG/ML IV BOLUS
INTRAVENOUS | Status: AC
  Filled 2020-03-11: qty 20

## 2020-03-11 MED ORDER — FENTANYL CITRATE (PF) 100 MCG/2ML IJ SOLN
INTRAMUSCULAR | Status: DC | PRN
  Administered 2020-03-11: 100 ug via INTRAVENOUS
  Administered 2020-03-11: 50 ug via INTRAVENOUS

## 2020-03-11 MED ORDER — FENTANYL CITRATE (PF) 250 MCG/5ML IJ SOLN
INTRAMUSCULAR | Status: AC
  Filled 2020-03-11: qty 5

## 2020-03-11 MED ORDER — ONDANSETRON HCL 4 MG/2ML IJ SOLN
INTRAMUSCULAR | Status: DC | PRN
  Administered 2020-03-11: 4 mg via INTRAVENOUS

## 2020-03-11 MED ORDER — ROCURONIUM BROMIDE 10 MG/ML (PF) SYRINGE
PREFILLED_SYRINGE | INTRAVENOUS | Status: AC
  Filled 2020-03-11: qty 10

## 2020-03-11 MED ORDER — PHENYLEPHRINE HCL-NACL 10-0.9 MG/250ML-% IV SOLN
INTRAVENOUS | Status: DC | PRN
  Administered 2020-03-11: 50 ug/min via INTRAVENOUS

## 2020-03-11 MED ORDER — ROCURONIUM BROMIDE 10 MG/ML (PF) SYRINGE
PREFILLED_SYRINGE | INTRAVENOUS | Status: DC | PRN
  Administered 2020-03-11: 50 mg via INTRAVENOUS

## 2020-03-11 SURGICAL SUPPLY — 46 items
APPLIER CLIP 5 13 M/L LIGAMAX5 (MISCELLANEOUS) ×4
CANISTER SUCT 3000ML PPV (MISCELLANEOUS) ×4 IMPLANT
CHLORAPREP W/TINT 26 (MISCELLANEOUS) ×4 IMPLANT
CLIP APPLIE 5 13 M/L LIGAMAX5 (MISCELLANEOUS) ×2 IMPLANT
CNTNR URN SCR LID CUP LEK RST (MISCELLANEOUS) ×2 IMPLANT
CONT SPEC 4OZ STRL OR WHT (MISCELLANEOUS) ×2
COVER SURGICAL LIGHT HANDLE (MISCELLANEOUS) ×4 IMPLANT
COVER WAND RF STERILE (DRAPES) ×4 IMPLANT
DERMABOND ADVANCED (GAUZE/BANDAGES/DRESSINGS) ×2
DERMABOND ADVANCED .7 DNX12 (GAUZE/BANDAGES/DRESSINGS) ×2 IMPLANT
ELECT REM PT RETURN 9FT ADLT (ELECTROSURGICAL) ×4
ELECTRODE REM PT RTRN 9FT ADLT (ELECTROSURGICAL) ×2 IMPLANT
ENDOLOOP SUT PDS II  0 18 (SUTURE) ×2
ENDOLOOP SUT PDS II 0 18 (SUTURE) ×2 IMPLANT
GLOVE BIO SURGEON STRL SZ 6 (GLOVE) ×4 IMPLANT
GLOVE BIO SURGEON STRL SZ8 (GLOVE) ×4 IMPLANT
GLOVE BIOGEL PI IND STRL 7.0 (GLOVE) ×2 IMPLANT
GLOVE BIOGEL PI IND STRL 8 (GLOVE) ×2 IMPLANT
GLOVE BIOGEL PI INDICATOR 7.0 (GLOVE) ×2
GLOVE BIOGEL PI INDICATOR 8 (GLOVE) ×2
GLOVE INDICATOR 6.5 STRL GRN (GLOVE) ×4 IMPLANT
GLOVE SS BIOGEL STRL SZ 7 (GLOVE) ×2 IMPLANT
GLOVE SUPERSENSE BIOGEL SZ 7 (GLOVE) ×2
GLOVE SURG SS PI 7.0 STRL IVOR (GLOVE) ×8 IMPLANT
GOWN STRL REUS W/ TWL LRG LVL3 (GOWN DISPOSABLE) ×6 IMPLANT
GOWN STRL REUS W/ TWL XL LVL3 (GOWN DISPOSABLE) ×2 IMPLANT
GOWN STRL REUS W/TWL LRG LVL3 (GOWN DISPOSABLE) ×6
GOWN STRL REUS W/TWL XL LVL3 (GOWN DISPOSABLE) ×2
GRASPER SUT TROCAR 14GX15 (MISCELLANEOUS) ×4 IMPLANT
HEMOSTAT SNOW SURGICEL 2X4 (HEMOSTASIS) ×4 IMPLANT
KIT BASIN OR (CUSTOM PROCEDURE TRAY) ×4 IMPLANT
KIT TURNOVER KIT B (KITS) ×4 IMPLANT
NEEDLE INSUFFLATION 14GA 120MM (NEEDLE) ×4 IMPLANT
NS IRRIG 1000ML POUR BTL (IV SOLUTION) ×4 IMPLANT
PAD ARMBOARD 7.5X6 YLW CONV (MISCELLANEOUS) ×4 IMPLANT
POUCH SPECIMEN RETRIEVAL 10MM (ENDOMECHANICALS) ×4 IMPLANT
SCISSORS LAP 5X35 DISP (ENDOMECHANICALS) ×4 IMPLANT
SET IRRIG TUBING LAPAROSCOPIC (IRRIGATION / IRRIGATOR) ×4 IMPLANT
SET TUBE SMOKE EVAC HIGH FLOW (TUBING) ×4 IMPLANT
SLEEVE ENDOPATH XCEL 5M (ENDOMECHANICALS) ×8 IMPLANT
SUT MNCRL AB 4-0 PS2 18 (SUTURE) ×4 IMPLANT
TOWEL GREEN STERILE (TOWEL DISPOSABLE) ×4 IMPLANT
TRAY LAPAROSCOPIC MC (CUSTOM PROCEDURE TRAY) ×4 IMPLANT
TROCAR XCEL NON-BLD 11X100MML (ENDOMECHANICALS) ×4 IMPLANT
TROCAR XCEL NON-BLD 5MMX100MML (ENDOMECHANICALS) ×4 IMPLANT
WATER STERILE IRR 1000ML POUR (IV SOLUTION) ×4 IMPLANT

## 2020-03-11 NOTE — Op Note (Signed)
Operative Note  Ann Estes 79 y.o. female [id number]  03/11/2020  Surgeon: Clovis Riley MD FACS  Assistant: Georganna Skeans MD FACS  Procedure performed: Laparoscopic Cholecystectomy  Procedure classification: emergent  Preop diagnosis: acute calculous cholecystitis Post-op diagnosis/intraop findings: same, severe, with purulent rind surrounding the gallbladder and adherent to the duodenum  Specimens: gallbladder  Retained items: none  EBL: [id number]  Complications: none  Description of procedure: After obtaining informed consent the patient was brought to the operating room. Antibiotics were administered. SCD's were applied. General endotracheal anesthesia was initiated and a formal time-out was performed. The abdomen was prepped and draped in the usual sterile fashion and the abdomen was entered using an infraumbilical veress needle after instilling the site with local. Insufflation to 27mmHg was obtained, 54mm trocar and camera inserted, and gross inspection revealed no evidence of injury from our entry or other intraabdominal abnormalities. Two 71mm trocars were introduced in the right midclavicular and right anterior axillary lines under direct visualization and following infiltration with local. An 84mm trocar was placed in the epigastrium.   The gallbladder was completely obscured by inflammatory adhesions to the omentum and duodenum which was drawn up over the gallbladder and quite adherent to it with a purulent rind.  These were gently and carefully bluntly peeled away until enough of the gallbladder could be visualized to confirm the structure.  The gallbladder was extremely acutely inflamed and tensely distended.  It was decompressed with the nezhat, expressing clear fluid consistent with hydrops.  This allowed [place] to grasp the fundus of the gallbladder and retracted cephalad.  Continued gentle blunt dissection was employed to further peel away inflammatory adhesions to the omentum and  duodenum.  The duodenum was inspected and while there was a purulent rind on the lateral aspect, there was no evidence of injury to it or fistula formation.   We were able to identify the infundibulum which was then grasped and retracted laterally. A combination of hook electrocautery and blunt dissection was utilized to clear the peritoneum from the neck and cystic duct, circumferentially isolating the cystic artery and cystic duct and lifting the gallbladder from the cystic plate. The critical view of safety was achieved with the cystic artery, cystic duct, and liver bed visualized between them with no other structures. The artery was clipped with two clips proximally and one distally and divided as was the cystic duct with three clips on the proximal end.  There was a small bleeding vessel on the edge of the divided cystic duct which was incompletely treated with cautery.  I elected to place a PDS Endoloop just proximal to the clips which was secured tightly and appropriately addressed the bleeding.    The gallbladder was dissected from the liver plate using electrocautery.  There was a small structure along the midportion of the liver bed entering the gallbladder which was clipped proximally and likely a small vessel versus duct of Luschka.  Once freed the gallbladder was placed in an endocatch bag and removed through the epigastric trocar site.  This incision had to be extended in order to extract the specimen.  A small amount of bleeding on the liver bed was controlled with cautery and Surgicel snow. This was aspirated and the right upper quadrant was irrigated copiously until the effluent was clear. Hemostasis was once again confirmed, and reinspection of the abdomen revealed no injuries. The clips and Endoloop were well opposed without any bile leak from the duct or the liver bed. The 28mm  trocar site in the epigastrium was closed with two interrupted 0 vicryls in the fascia under direct visualization  using a PMI device. The abdomen was desufflated and all trocars removed. The skin incisions were closed with subcuticular monocryl and Dermabond. The patient was awakened, extubated and transported to the recovery room in stable condition.    All counts were correct at the completion of the case.

## 2020-03-11 NOTE — Anesthesia Preprocedure Evaluation (Signed)
Anesthesia Evaluation  Patient identified by MRN, date of birth, ID band Patient awake    Reviewed: Allergy & Precautions, NPO status , Patient's Chart, lab work & pertinent test results  Airway Mallampati: II  TM Distance: >3 FB Neck ROM: Full    Dental no notable dental hx.    Pulmonary former smoker,    Pulmonary exam normal breath sounds clear to auscultation       Cardiovascular Normal cardiovascular exam Rhythm:Regular Rate:Normal  PVC's  Myocardial Perfusion: Nuclear stress EF: 58%. Blood pressure demonstrated a normal response to exercise. There was no ST segment deviation noted during stress. No T wave inversion was noted during stress. Defect 1: There is a small defect of mild severity. This is a low risk study.  ECHO: 1. The left ventricle has normal systolic function, with an ejection fraction of 55-60%. The cavity size was normal. Left ventricular diastolic Doppler parameters are consistent with impaired relaxation. Elevated left atrial and left ventricular  end-diastolic pressures. 2. The right ventricle has normal systolic function. The cavity was normal. There is no increase in right ventricular wall thickness. 3. Mild thickening of the aortic valve. 4. The aortic root and ascending aorta are normal in size and structure.   Neuro/Psych negative neurological ROS  negative psych ROS   GI/Hepatic negative GI ROS, Neg liver ROS,   Endo/Other  negative endocrine ROS  Renal/GU negative Renal ROS     Musculoskeletal negative musculoskeletal ROS (+)   Abdominal   Peds  Hematology negative hematology ROS (+)   Anesthesia Other Findings acute cholecystitis  Reproductive/Obstetrics                             Anesthesia Physical Anesthesia Plan  ASA: II  Anesthesia Plan: General   Post-op Pain Management:    Induction: Intravenous  PONV Risk Score and Plan: 3 and  Ondansetron, Dexamethasone, Midazolam and Treatment may vary due to age or medical condition  Airway Management Planned: Oral ETT  Additional Equipment:   Intra-op Plan:   Post-operative Plan: Extubation in OR  Informed Consent: I have reviewed the patients History and Physical, chart, labs and discussed the procedure including the risks, benefits and alternatives for the proposed anesthesia with the patient or authorized representative who has indicated his/her understanding and acceptance.     Dental advisory given  Plan Discussed with: CRNA  Anesthesia Plan Comments:         Anesthesia Quick Evaluation

## 2020-03-11 NOTE — Anesthesia Procedure Notes (Signed)
Procedure Name: Intubation Date/Time: 03/11/2020 9:14 AM Performed by: Kyung Rudd, CRNA Pre-anesthesia Checklist: Patient identified, Emergency Drugs available, Suction available and Patient being monitored Patient Re-evaluated:Patient Re-evaluated prior to induction Oxygen Delivery Method: Circle system utilized Preoxygenation: Pre-oxygenation with 100% oxygen Induction Type: IV induction Ventilation: Mask ventilation without difficulty Laryngoscope Size: Mac and 3 Grade View: Grade III Tube type: Oral Tube size: 7.0 mm Number of attempts: 1 Airway Equipment and Method: Stylet Placement Confirmation: positive ETCO2 and breath sounds checked- equal and bilateral Secured at: 18 cm Tube secured with: Tape Dental Injury: Teeth and Oropharynx as per pre-operative assessment

## 2020-03-11 NOTE — Transfer of Care (Signed)
Immediate Anesthesia Transfer of Care Note  Patient: Ann Estes  Procedure(s) Performed: LAPAROSCOPIC CHOLECYSTECTOMY (N/A Abdomen)  Patient Location: PACU  Anesthesia Type:General  Level of Consciousness: awake, alert  and oriented  Airway & Oxygen Therapy: Patient Spontanous Breathing  Post-op Assessment: Report given to RN, Post -op Vital signs reviewed and stable and Patient moving all extremities X 4  Post vital signs: Reviewed and stable  Last Vitals:  Vitals Value Taken Time  BP 118/55 03/11/20 1045  Temp 36.8 C 03/11/20 1045  Pulse 89 03/11/20 1045  Resp 17 03/11/20 1045  SpO2 100 % 03/11/20 1045    Last Pain:  Vitals:   03/11/20 1045  TempSrc:   PainSc: 0-No pain         Complications: No apparent anesthesia complications

## 2020-03-11 NOTE — Plan of Care (Signed)

## 2020-03-11 NOTE — Plan of Care (Signed)

## 2020-03-11 NOTE — Interval H&P Note (Signed)
History and Physical Interval Note:  03/11/2020 8:11 AM  Ann Estes  has presented today for surgery, with the diagnosis of acute cholecystitis.  The various methods of treatment have been discussed with the patient and family. After consideration of risks, benefits and other options for treatment, the patient has consented to  Procedure(s): LAPAROSCOPIC CHOLECYSTECTOMY WITH possible INTRAOPERATIVE CHOLANGIOGRAM (N/A) as a surgical intervention.  The patient's history has been reviewed, patient examined, no change in status, stable for surgery.  I have reviewed the patient's chart and labs.  Questions were answered to the patient's satisfaction.     Ann Estes

## 2020-03-11 NOTE — Anesthesia Postprocedure Evaluation (Signed)
Anesthesia Post Note  Patient: Sita Mcguiness  Procedure(s) Performed: LAPAROSCOPIC CHOLECYSTECTOMY (N/A Abdomen)     Patient location during evaluation: PACU Anesthesia Type: General Level of consciousness: awake and alert Pain management: pain level controlled Vital Signs Assessment: post-procedure vital signs reviewed and stable Respiratory status: spontaneous breathing, nonlabored ventilation, respiratory function stable and patient connected to nasal cannula oxygen Cardiovascular status: blood pressure returned to baseline and stable Postop Assessment: no apparent nausea or vomiting Anesthetic complications: no    Last Vitals:  Vitals:   03/11/20 1100 03/11/20 1122  BP: 117/76 (!) 121/53  Pulse: 86 83  Resp: 14 17  Temp: 36.7 C 36.9 C  SpO2: 99% 97%    Last Pain:  Vitals:   03/11/20 1122  TempSrc: Axillary  PainSc:                  Karyl Kinnier Oakleigh Hesketh

## 2020-03-12 LAB — COMPREHENSIVE METABOLIC PANEL
ALT: 24 U/L (ref 0–44)
AST: 31 U/L (ref 15–41)
Albumin: 2.3 g/dL — ABNORMAL LOW (ref 3.5–5.0)
Alkaline Phosphatase: 55 U/L (ref 38–126)
Anion gap: 7 (ref 5–15)
BUN: 10 mg/dL (ref 8–23)
CO2: 22 mmol/L (ref 22–32)
Calcium: 8 mg/dL — ABNORMAL LOW (ref 8.9–10.3)
Chloride: 106 mmol/L (ref 98–111)
Creatinine, Ser: 0.57 mg/dL (ref 0.44–1.00)
GFR calc Af Amer: >60 mL/min (ref >60)
GFR calc non Af Amer: >60 mL/min (ref >60)
Glucose, Bld: 145 mg/dL — ABNORMAL HIGH (ref 70–99)
Potassium: 4.9 mmol/L (ref 3.5–5.1)
Sodium: 135 mmol/L (ref 135–145)
Total Bilirubin: 0.6 mg/dL (ref 0.3–1.2)
Total Protein: 5.2 g/dL — ABNORMAL LOW (ref 6.5–8.1)

## 2020-03-12 LAB — CBC
HCT: 34.9 % — ABNORMAL LOW (ref 36.0–46.0)
Hemoglobin: 11.2 g/dL — ABNORMAL LOW (ref 12.0–15.0)
MCH: 29.8 pg (ref 26.0–34.0)
MCHC: 32.1 g/dL (ref 30.0–36.0)
MCV: 92.8 fL (ref 80.0–100.0)
Platelets: 178 10*3/uL (ref 150–400)
RBC: 3.76 MIL/uL — ABNORMAL LOW (ref 3.87–5.11)
RDW: 12.8 % (ref 11.5–15.5)
WBC: 10.4 10*3/uL (ref 4.0–10.5)
nRBC: 0 % (ref 0.0–0.2)

## 2020-03-12 MED ORDER — OXYCODONE HCL 5 MG PO TABS: 5.0000 mg | ORAL_TABLET | ORAL | 0 refills | Status: DC | PRN

## 2020-03-12 NOTE — Discharge Instructions (Signed)
[facility]  Please arrive at least 30 min before your appointment to complete your check in paperwork.  If you are unable to arrive 30 min prior to your appointment time we may have to cancel or reschedule you. LAPAROSCOPIC SURGERY: POST OP INSTRUCTIONS Always review your discharge instruction sheet given to you by the facility where your surgery was performed. IF YOU HAVE DISABILITY OR FAMILY LEAVE FORMS, YOU MUST BRING THEM TO THE OFFICE FOR PROCESSING.   DO NOT GIVE THEM TO YOUR DOCTOR.  PAIN CONTROL  1. First take acetaminophen (Tylenol) AND/or ibuprofen (Advil) to control your pain after surgery.  Follow directions on package.  Taking acetaminophen (Tylenol) and/or ibuprofen (Advil) regularly after surgery will help to control your pain and lower the amount of prescription pain medication you may need.  You should not take more than 4,000 mg (4 grams) of acetaminophen (Tylenol) in 24 hours.  You should not take ibuprofen (Advil), aleve, motrin, naprosyn or other NSAIDS if you have a history of stomach ulcers or chronic kidney disease.  2. A prescription for pain medication may be given to you upon discharge.  Take your pain medication as prescribed, if you still have uncontrolled pain after taking acetaminophen (Tylenol) or ibuprofen (Advil). 3. Use ice packs to help control pain. 4. If you need a refill on your pain medication, please contact your pharmacy.  They will contact our office to request authorization. Prescriptions will not be filled after 5pm or on week-ends.  HOME MEDICATIONS 5. Take your usually prescribed medications unless otherwise directed.  DIET 6. You should follow a light diet the first few days after arrival home.  Be sure to include lots of fluids daily. Avoid fatty, fried foods.   CONSTIPATION 7. It is common to experience some constipation after surgery and if you are taking pain medication.  Increasing fluid intake and taking a stool  softener (such as Colace) will usually help or prevent this problem from occurring.  A mild laxative (Milk of Magnesia or Miralax) should be taken according to package instructions if there are no bowel movements after 48 hours.  WOUND/INCISION CARE 8. Most patients will experience some swelling and bruising in the area of the incisions.  Ice packs will help.  Swelling and bruising can take several days to resolve.  9. Unless discharge instructions indicate otherwise, follow guidelines below  a. STERI-STRIPS - you may remove your outer bandages 48 hours after surgery, and you may shower at that time.  You have steri-strips (small skin tapes) in place directly over the incision.  These strips should be left on the skin for 7-10 days.   b. DERMABOND/SKIN GLUE - you may shower in 24 hours.  The glue will flake off over the next 2-3 weeks. 10. Any sutures or staples will be removed at the office during your follow-up visit.  ACTIVITIES 11. You may resume regular (light) daily activities beginning the next day--such as daily self-care, walking, climbing stairs--gradually increasing activities as tolerated.  You may have sexual intercourse when it is comfortable.  Refrain from any heavy lifting or straining until approved by your doctor. a. You may drive when you are no longer taking prescription pain medication, you can comfortably wear a seatbelt, and you can safely maneuver your car and apply brakes.  FOLLOW-UP 12. You should see your doctor in the office for a follow-up appointment approximately 2-3 weeks after your surgery.  You should have been given your post-op/follow-up appointment when   your surgery was scheduled.  If you did not receive a post-op/follow-up appointment, make sure that you call for this appointment within a day or two after you arrive home to insure a convenient appointment time.   WHEN TO CALL YOUR DOCTOR: 1. Fever over 101.0 2. Inability to urinate 3. Continued bleeding from  incision. 4. Increased pain, redness, or drainage from the incision. 5. Increasing abdominal pain  The clinic staff is available to answer your questions during regular business hours.  Please don't hesitate to call and ask to speak to one of the nurses for clinical concerns.  If you have a medical emergency, go to the nearest emergency room or call 911.  A surgeon from [facility] is always on call at the hospital. [address], [address], [place], [place]  [address] ? [address] [address], [place], [place]   [address] [phone] ? [phone] ? FAX [phone]  .........   Managing Your Pain After Surgery Without Opioids    Thank you for participating in our program to help patients manage their pain after surgery without opioids. This is part of our effort to provide you with the best care possible, without exposing you or your family to the risk that opioids pose.  What pain can I expect after surgery? You can expect to have some pain after surgery. This is normal. The pain is typically worse the day after surgery, and quickly begins to get better. Many studies have found that many patients are able to manage their pain after surgery with Over-the-Counter (OTC) medications such as Tylenol and Motrin. If you have a condition that does not allow you to take Tylenol or Motrin, notify your surgical team.  How will I manage my pain? The best strategy for controlling your pain after surgery is around the clock pain control with Tylenol (acetaminophen) and Motrin (ibuprofen or Advil). Alternating these medications with each other allows you to maximize your pain control. In addition to Tylenol and Motrin, you can use heating pads or ice packs on your incisions to help reduce your pain.  How will I alternate your regular strength over-the-counter pain medication? You will take a dose of pain medication every three hours. ; Start by taking 650 mg of Tylenol (2 pills of 325  mg) ; 3 hours later take 600 mg of Motrin (3 pills of 200 mg) ; 3 hours after taking the Motrin take 650 mg of Tylenol ; 3 hours after that take 600 mg of Motrin.   - 1 -  See example - if your first dose of Tylenol is at 12:00 PM   12:00 PM Tylenol 650 mg (2 pills of 325 mg)  3:00 PM Motrin 600 mg (3 pills of 200 mg)  6:00 PM Tylenol 650 mg (2 pills of 325 mg)  9:00 PM Motrin 600 mg (3 pills of 200 mg)  Continue alternating every 3 hours   We recommend that you follow this schedule around-the-clock for at least 3 days after surgery, or until you feel that it is no longer needed. Use the table on the last page of this handout to keep track of the medications you are taking. Important: Do not take more than 3000mg of Tylenol or 3200mg of Motrin in a 24-hour period. Do not take ibuprofen/Motrin if you have a history of bleeding stomach ulcers, severe kidney disease, &/or actively taking a blood thinner  What if I still have pain? If you have pain that is not   controlled with the over-the-counter pain medications (Tylenol and Motrin or Advil) you might have what we call "breakthrough" pain. You will receive a prescription for a small amount of an opioid pain medication such as Oxycodone, Tramadol, or Tylenol with Codeine. Use these opioid pills in the first 24 hours after surgery if you have breakthrough pain. Do not take more than 1 pill every 4-6 hours.  If you still have uncontrolled pain after using all opioid pills, don't hesitate to call our staff using the number provided. We will help make sure you are managing your pain in the best way possible, and if necessary, we can provide a prescription for additional pain medication.   Day 1    Time  Name of Medication Number of pills taken  Amount of Acetaminophen  Pain Level   Comments  AM PM       AM PM       AM PM       AM PM       AM PM       AM PM       AM PM       AM PM       Total Daily amount of Acetaminophen Do not  take more than  3,000 mg per day      Day 2    Time  Name of Medication Number of pills taken  Amount of Acetaminophen  Pain Level   Comments  AM PM       AM PM       AM PM       AM PM       AM PM       AM PM       AM PM       AM PM       Total Daily amount of Acetaminophen Do not take more than  3,000 mg per day      Day 3    Time  Name of Medication Number of pills taken  Amount of Acetaminophen  Pain Level   Comments  AM PM       AM PM       AM PM       AM PM          AM PM       AM PM       AM PM       AM PM       Total Daily amount of Acetaminophen Do not take more than  3,000 mg per day      Day 4    Time  Name of Medication Number of pills taken  Amount of Acetaminophen  Pain Level   Comments  AM PM       AM PM       AM PM       AM PM       AM PM       AM PM       AM PM       AM PM       Total Daily amount of Acetaminophen Do not take more than  3,000 mg per day      Day 5    Time  Name of Medication Number of pills taken  Amount of Acetaminophen  Pain Level   Comments  AM PM       AM PM       AM   PM       AM PM       AM PM       AM PM       AM PM       AM PM       Total Daily amount of Acetaminophen Do not take more than  3,000 mg per day       Day 6    Time  Name of Medication Number of pills taken  Amount of Acetaminophen  Pain Level  Comments  AM PM       AM PM       AM PM       AM PM       AM PM       AM PM       AM PM       AM PM       Total Daily amount of Acetaminophen Do not take more than  3,000 mg per day      Day 7    Time  Name of Medication Number of pills taken  Amount of Acetaminophen  Pain Level   Comments  AM PM       AM PM       AM PM       AM PM       AM PM       AM PM       AM PM       AM PM       Total Daily amount of Acetaminophen Do not take more than  3,000 mg per day        For additional information about how and where to safely dispose of unused  opioid medications - https://www.morepowerfulnc.org  Disclaimer: This document contains information and/or instructional materials adapted from [facility] for the typical patient with your condition. It does not replace medical advice from your health care provider because your experience may differ from that of the typical patient. Talk to your health care provider if you have any questions about this document, your condition or your treatment plan. Adapted from [facility]   

## 2020-03-12 NOTE — Discharge Summary (Signed)
Physician Discharge Summary  Patient ID: Ann Estes MRN: [id number] DOB/AGE: August 05, 1941 79 y.o.  Admit date: 03/10/2020 Discharge date: 03/12/2020  Admission Diagnoses:  Acute calculous cholecystitis  Discharge Diagnoses: same Active Problems:   Acute cholecystitis due to biliary calculus   Discharged Condition: good  Hospital Course: Admitted 03/10/20 with acute calculus cholecystitis.  Underwent lap chole on 4/17.  Feeling better on POD #1.  Tolerating diet, pain controlled.  LFT's WNL.   Treatments: surgery: lap chole by Dr. Kae Heller 03/11/20  Discharge Exam: Blood pressure (!) 105/54, pulse 66, temperature 98.3 F (36.8 C), temperature source Oral, resp. rate 15, height 5\' 2"  (1.575 m), weight 58.7 kg, SpO2 98 %. General appearance: alert, cooperative and no distress GI: soft, incisional tenderness; incisions c/d/i  Skin no jaundice   Disposition: Discharge disposition: 01-Home or Self Care       Discharge Instructions    Call MD for:  persistant nausea and vomiting   Complete by: As directed    Call MD for:  redness, tenderness, or signs of infection (pain, swelling, redness, odor or green/yellow discharge around incision site)   Complete by: As directed    Call MD for:  severe uncontrolled pain   Complete by: As directed    Call MD for:  temperature >100.4   Complete by: As directed    Diet general   Complete by: As directed    Driving Restrictions   Complete by: As directed    Do not drive while taking pain medications   Increase activity slowly   Complete by: As directed    May shower / Bathe   Complete by: As directed      Allergies as of 03/12/2020   No Known Allergies     Medication List    TAKE these medications   Calcium 600-D 600-400 MG-UNIT Tabs Generic drug: Calcium Carbonate-Vitamin D3 Take 1 tablet by mouth 2 (two) times daily.   GREEN TEA PO Take by mouth.   latanoprost 0.005 % ophthalmic solution Commonly known as: XALATAN Place 1  drop into both eyes at bedtime.   meloxicam 7.5 MG tablet Commonly known as: MOBIC TAKE 1 TABLET (7.5 MG TOTAL) BY MOUTH 2 (TWO) TIMES DAILY AS NEEDED FOR PAIN. What changed: See the new instructions.   metoprolol succinate 25 MG 24 hr tablet Commonly known as: TOPROL-XL Take 1 tablet (25 mg total) by mouth daily.   oxyCODONE 5 MG immediate release tablet Commonly known as: Oxy IR/ROXICODONE Take 1 tablet (5 mg total) by mouth every 4 (four) hours as needed for severe pain or breakthrough pain.      Follow-up Information    [facility] Follow up in 3 week(s).   Specialty: General Surgery Why: our office will call you with appointment date and time  Contact information: [place] [address] [phone]           Signed: Maia Petties 03/12/2020, 9:09 AM

## 2020-03-14 LAB — SURGICAL PATHOLOGY

## 2020-04-02 ENCOUNTER — Other Ambulatory Visit: Payer: Self-pay | Admitting: Interventional Cardiology

## 2020-05-16 NOTE — Telephone Encounter (Signed)
Ann Estes, can you answer? I don't know of any names.

## 2020-06-07 DIAGNOSIS — D485 Neoplasm of uncertain behavior of skin: Secondary | ICD-10-CM | POA: Diagnosis not present

## 2020-06-07 DIAGNOSIS — L988 Other specified disorders of the skin and subcutaneous tissue: Secondary | ICD-10-CM | POA: Diagnosis not present

## 2020-06-22 NOTE — Progress Notes (Signed)
Cardiology Office Note:    Date:  06/23/2020   ID:  Ann Estes, DOB 07/09/1941, MRN [id number]  PCP:  Pleas Koch, NP  Cardiologist:  Sinclair Grooms, MD   Referring MD: Pleas Koch, NP   Chief Complaint  Patient presents with  . Irregular Heart Beat    Palpitations    History of Present Illness:    Ann Estes is a 79 y.o. 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   She is concerned about whether she has vascular disease.  She has no cardiac complaints and denies claudication and transient neurological symptoms.  On low-dose beta-blocker therapy, metoprolol succinate 25 mg/day, palpitations have resolved.  Past Medical History:  Diagnosis Date  . BPPV (benign paroxysmal positional vertigo)   . Chicken pox   . Glaucoma   . Hyperlipidemia   . Osteoarthritis   . Palpitations 10/13/2018    Past Surgical History:  Procedure Laterality Date  . AUGMENTATION MAMMAPLASTY Bilateral ?   Impants in and then removed   . BREAST IMPLANT REMOVAL    . CATARACT EXTRACTION Bilateral   . CHOLECYSTECTOMY N/A 03/11/2020   Procedure: LAPAROSCOPIC CHOLECYSTECTOMY;  Surgeon: Clovis Riley, MD;  Location: [facility];  Service: General;  Laterality: N/A;  . COSMETIC SURGERY     Eyelids, tummy tuck  . PLACEMENT OF BREAST IMPLANTS    . 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 TOTAL HIP ARTHROPLASTY Left 02/2005  . TRIGGER FINGER RELEASE  11/2016   Thumb    Current Medications: Current Meds  Medication Sig  . Calcium Carbonate-Vitamin D3 (CALCIUM 600-D) 600-400 MG-UNIT TABS Take 1 tablet by mouth 2 (two) times daily.   [place] Cowden Tea, Camillia sinensis, (GREEN TEA PO) Take by mouth.   . latanoprost (XALATAN) 0.005 % ophthalmic solution Place 1 drop into both eyes at bedtime.   . meloxicam (MOBIC) 7.5 MG tablet TAKE 1 TABLET (7.5 MG TOTAL) BY MOUTH 2 (TWO) TIMES DAILY AS NEEDED FOR PAIN.  . metoprolol  succinate (TOPROL-XL) 25 MG 24 hr tablet TAKE 1 TABLET BY MOUTH EVERY DAY     Allergies:   Patient has no known allergies.   Social History   Socioeconomic History  . Marital status: Divorced    Spouse name: Not on file  . Number of children: Not on file  . Years of education: Not on file  . Highest education level: Not on file  Occupational History  . Not on file  Tobacco Use  . Smoking status: Former Research scientist (life sciences)  . Smokeless tobacco: Never Used  Vaping Use  . Vaping Use: Never used  Substance and Sexual Activity  . Alcohol use: Yes    Comment: twice a year  . Drug use: No  . Sexual activity: Not on file  Other Topics Concern  . Not on file  Social History Narrative  . Not on file   Social Determinants of Health   Financial Resource Strain: Low Risk   . Difficulty of Paying Living Expenses: Not hard at all  Food Insecurity: No Food Insecurity  . Worried About Charity fundraiser in the Last Year: Never true  . Ran Out of Food in the Last Year: Never true  Transportation Needs: No Transportation Needs  . Lack of Transportation (Medical): No  . Lack of Transportation (Non-Medical): No  Physical Activity: Sufficiently Active  . Days of Exercise per Week: 7 days  . Minutes  of Exercise per Session: 50 min  Stress: No Stress Concern Present  . Feeling of Stress : Not at all  Social Connections:   . Frequency of Communication with Friends and Family:   . Frequency of Social Gatherings with Friends and Family:   . Attends Religious Services:   . Active Member of Clubs or Organizations:   . Attends Archivist Meetings:   Marland Kitchen Marital Status:      Family History: The patient's family history includes Arthritis in her son; Asthma in her sister and sister; Birth defects in her daughter; Cancer in her father and maternal grandmother; Depression in her sister; Heart attack in her maternal grandfather; Heart disease in her mother.  ROS:   Please see the history of  present illness.    There is a family history of atherosclerosis with mother suffering a heart attack at 56 sister age 47 has had previous stents.  The patient denies shortness of breath.  Muscle aches and pains have improved after she got off of multiple over-the-counter supplements.  All other systems reviewed and are negative.  EKGs/Labs/Other Studies Reviewed:    The following studies were reviewed today:  2D Doppler echocardiogram 2020: IMPRESSIONS    1. The left ventricle has normal systolic function, with an ejection  fraction of 55-60%. The cavity size was normal. Left ventricular diastolic  Doppler parameters are consistent with impaired relaxation. Elevated left  atrial and left ventricular  end-diastolic pressures.  2. The right ventricle has normal systolic function. The cavity was  normal. There is no increase in right ventricular wall thickness.  3. Mild thickening of the aortic valve.  4. The aortic root and ascending aorta are normal in size and structure.   EKG:  EKG normal sinus rhythm, nonspecific ST abnormality, possible ectopic atrial rhythm.  Recent Labs: 03/12/2020: ALT 24; BUN 10; Creatinine, Ser 0.57; Hemoglobin 11.2; Platelets 178; Potassium 4.9; Sodium 135  Recent Lipid Panel    Component Value Date/Time   CHOL 220 (H) 02/29/2020 0948   TRIG 123.0 02/29/2020 0948   HDL 74.00 02/29/2020 0948   CHOLHDL 3 02/29/2020 0948   VLDL 24.6 02/29/2020 0948   LDLCALC 121 (H) 02/29/2020 0948    Physical Exam:    VS:  BP (!) 112/62   Pulse 71   Ht 5\' 2"  (1.575 m)   Wt 122 lb 12.8 oz (55.7 kg)   SpO2 96%   BMI 22.46 kg/m     Wt Readings from Last 3 Encounters:  06/23/20 122 lb 12.8 oz (55.7 kg)  03/10/20 129 lb 8 oz (58.7 kg)  03/07/20 129 lb (58.5 kg)     GEN: Healthy-appearing. No acute distress HEENT: Normal NECK: No JVD. LYMPHATICS: No lymphadenopathy CARDIAC:  RRR without murmur, gallop, or edema. VASCULAR:  Normal Pulses. No  bruits. RESPIRATORY:  Clear to auscultation without rales, wheezing or rhonchi  ABDOMEN: Soft, non-tender, non-distended, No pulsatile mass, MUSCULOSKELETAL: No deformity  SKIN: Warm and dry NEUROLOGIC:  Alert and oriented x 3 PSYCHIATRIC:  Normal affect   ASSESSMENT:    1. PVC (premature ventricular contraction)   2. PSVT (paroxysmal supraventricular tachycardia) ([place])   3. Hyperlipidemia, unspecified hyperlipidemia type   4. DOE (dyspnea on exertion)   5. Aortic atherosclerosis ([place])   6. Educated about COVID-19 virus infection    PLAN:    In order of problems listed above:  1. Controlled on low-dose beta-blocker 2. Controlled on low-dose beta-blocker 3. Not being treated.  Given the  identification of aortic atherosclerosis, I would recommend treating lipids to LDL less than 70.  I made this a formal recommendation to the patient. 4. Resolved 5. Primary prevention discussed in detail.  Please see below. 6. Masking, social distancing, and vaccination discussed.  She has been vaccinated.  Overall education and awareness concerning primary risk prevention was discussed in detail: LDL less than 70, hemoglobin A1c less than 7, blood pressure target less than 130/80 mmHg, >150 minutes of moderate aerobic activity per week, avoidance of smoking, weight control (via diet and exercise), and continued surveillance/management of/for obstructive sleep apnea.   Follow-up will be as needed.  Primary care will assume responsibility for metoprolol.  I have strongly encouraged and addition of statin therapy.  Medication Adjustments/Labs and Tests Ordered: Current medicines are reviewed at length with the patient today.  Concerns regarding medicines are outlined above.  Orders Placed This Encounter  Procedures  . EKG 12-Lead   No orders of the defined types were placed in this encounter.   Patient Instructions  Medication Instructions:  Your physician recommends that you continue on your  current medications as directed. Please refer to the Current Medication list given to you today.  *If you need a refill on your cardiac medications before your next appointment, please call your pharmacy*   Lab Work: None If you have labs (blood work) drawn today and your tests are completely normal, you will receive your results only by: Marland Kitchen MyChart Message (if you have MyChart) OR . A paper copy in the mail If you have any lab test that is abnormal or we need to change your treatment, we will call you to review the results.   Testing/Procedures: None   Follow-Up: At [facility], you and your health needs are our priority.  As part of our continuing mission to provide you with exceptional heart care, we have created designated Provider Care Teams.  These Care Teams include your primary Cardiologist (physician) and Advanced Practice Providers (APPs -  Physician Assistants and Nurse Practitioners) who all work together to provide you with the care you need, when you need it.  We recommend signing up for the patient portal called "MyChart".  Sign up information is provided on this After Visit Summary.  MyChart is used to connect with patients for Virtual Visits (Telemedicine).  Patients are able to view lab/test results, encounter notes, upcoming appointments, etc.  Non-urgent messages can be sent to your provider as well.   To learn more about what you can do with MyChart, go to [email or web address].    Your next appointment:   As needed  The format for your next appointment:   In Person  Provider:   You may see Sinclair Grooms, MD or one of the following Advanced Practice Providers on your designated Care Team:    Truitt Merle, NP  Cecilie Kicks, NP  Kathyrn Drown, NP    Other Instructions      Signed, Sinclair Grooms, MD  06/23/2020 12:35 PM    [place]

## 2020-06-23 ENCOUNTER — Ambulatory Visit (INDEPENDENT_AMBULATORY_CARE_PROVIDER_SITE_OTHER): Payer: Medicare Other | Admitting: Interventional Cardiology

## 2020-06-23 ENCOUNTER — Encounter: Payer: Self-pay | Admitting: Interventional Cardiology

## 2020-06-23 ENCOUNTER — Other Ambulatory Visit: Payer: Self-pay

## 2020-06-23 VITALS — BP 112/62 | HR 71 | Ht 62.0 in | Wt 122.8 lb

## 2020-06-23 DIAGNOSIS — I700 Atherosclerosis of aorta: Secondary | ICD-10-CM

## 2020-06-23 DIAGNOSIS — R0600 Dyspnea, unspecified: Secondary | ICD-10-CM

## 2020-06-23 DIAGNOSIS — Z7189 Other specified counseling: Secondary | ICD-10-CM

## 2020-06-23 DIAGNOSIS — E785 Hyperlipidemia, unspecified: Secondary | ICD-10-CM | POA: Diagnosis not present

## 2020-06-23 DIAGNOSIS — I471 Supraventricular tachycardia: Secondary | ICD-10-CM

## 2020-06-23 DIAGNOSIS — I493 Ventricular premature depolarization: Secondary | ICD-10-CM | POA: Diagnosis not present

## 2020-06-23 DIAGNOSIS — R0609 Other forms of dyspnea: Secondary | ICD-10-CM

## 2020-06-23 NOTE — Patient Instructions (Signed)
Medication Instructions:  Your physician recommends that you continue on your current medications as directed. Please refer to the Current Medication list given to you today.  *If you need a refill on your cardiac medications before your next appointment, please call your pharmacy*   Lab Work: None If you have labs (blood work) drawn today and your tests are completely normal, you will receive your results only by: . MyChart Message (if you have MyChart) OR . A paper copy in the mail If you have any lab test that is abnormal or we need to change your treatment, we will call you to review the results.   Testing/Procedures: None   Follow-Up: At [facility], you and your health needs are our priority.  As part of our continuing mission to provide you with exceptional heart care, we have created designated Provider Care Teams.  These Care Teams include your primary Cardiologist (physician) and Advanced Practice Providers (APPs -  Physician Assistants and Nurse Practitioners) who all work together to provide you with the care you need, when you need it.  We recommend signing up for the patient portal called "MyChart".  Sign up information is provided on this After Visit Summary.  MyChart is used to connect with patients for Virtual Visits (Telemedicine).  Patients are able to view lab/test results, encounter notes, upcoming appointments, etc.  Non-urgent messages can be sent to your provider as well.   To learn more about what you can do with MyChart, go to [email or web address].    Your next appointment:   As needed  The format for your next appointment:   In Person  Provider:   You may see Henry W Smith III, MD or one of the following Advanced Practice Providers on your designated Care Team:    Lori Gerhardt, NP  Laura Ingold, NP  Jill McDaniel, NP    Other Instructions   

## 2020-06-27 DIAGNOSIS — H401122 Primary open-angle glaucoma, left eye, moderate stage: Secondary | ICD-10-CM | POA: Diagnosis not present

## 2020-06-30 ENCOUNTER — Other Ambulatory Visit: Payer: Self-pay | Admitting: Interventional Cardiology

## 2020-07-02 DIAGNOSIS — E785 Hyperlipidemia, unspecified: Secondary | ICD-10-CM

## 2020-07-13 NOTE — Telephone Encounter (Signed)
Please place orders, as patient scheduled for 8/20, as per patient's request.

## 2020-07-14 ENCOUNTER — Other Ambulatory Visit: Payer: Self-pay

## 2020-07-14 ENCOUNTER — Other Ambulatory Visit (INDEPENDENT_AMBULATORY_CARE_PROVIDER_SITE_OTHER): Payer: Medicare Other

## 2020-07-14 DIAGNOSIS — E785 Hyperlipidemia, unspecified: Secondary | ICD-10-CM

## 2020-07-14 LAB — COMPREHENSIVE METABOLIC PANEL
ALT: 13 U/L (ref 0–35)
AST: 19 U/L (ref 0–37)
Albumin: 4.4 g/dL (ref 3.5–5.2)
Alkaline Phosphatase: 63 U/L (ref 39–117)
BUN: 18 mg/dL (ref 6–23)
CO2: 28 mEq/L (ref 19–32)
Calcium: 9.5 mg/dL (ref 8.4–10.5)
Chloride: 107 mEq/L (ref 96–112)
Creatinine, Ser: 0.72 mg/dL (ref 0.40–1.20)
GFR: 78.44 mL/min (ref >60.00)
Glucose, Bld: 91 mg/dL (ref 70–99)
Potassium: 4.7 mEq/L (ref 3.5–5.1)
Sodium: 142 mEq/L (ref 135–145)
Total Bilirubin: 1.1 mg/dL (ref 0.2–1.2)
Total Protein: 6.8 g/dL (ref 6.0–8.3)

## 2020-07-14 LAB — LIPID PANEL
Cholesterol: 204 mg/dL — ABNORMAL HIGH (ref 0–200)
HDL: 64.4 mg/dL (ref >39.00)
LDL Cholesterol: 105 mg/dL — ABNORMAL HIGH (ref 0–99)
NonHDL: 139.61
Total CHOL/HDL Ratio: 3
Triglycerides: 171 mg/dL — ABNORMAL HIGH (ref 0.0–149.0)
VLDL: 34.2 mg/dL (ref 0.0–40.0)

## 2020-07-19 MED ORDER — ROSUVASTATIN CALCIUM 5 MG PO TABS: 5.0000 mg | ORAL_TABLET | Freq: Every evening | ORAL | 3 refills | Status: DC

## 2020-08-08 DIAGNOSIS — H401122 Primary open-angle glaucoma, left eye, moderate stage: Secondary | ICD-10-CM | POA: Diagnosis not present

## 2020-08-15 ENCOUNTER — Other Ambulatory Visit: Payer: Self-pay | Admitting: Primary Care

## 2020-08-15 DIAGNOSIS — Z1231 Encounter for screening mammogram for malignant neoplasm of breast: Secondary | ICD-10-CM

## 2020-08-25 DIAGNOSIS — Z23 Encounter for immunization: Secondary | ICD-10-CM | POA: Diagnosis not present

## 2020-09-15 DIAGNOSIS — Z23 Encounter for immunization: Secondary | ICD-10-CM | POA: Diagnosis not present

## 2020-09-19 ENCOUNTER — Other Ambulatory Visit: Payer: Self-pay | Admitting: Primary Care

## 2020-09-19 DIAGNOSIS — E785 Hyperlipidemia, unspecified: Secondary | ICD-10-CM

## 2020-09-22 ENCOUNTER — Other Ambulatory Visit (INDEPENDENT_AMBULATORY_CARE_PROVIDER_SITE_OTHER): Payer: Medicare Other

## 2020-09-22 ENCOUNTER — Other Ambulatory Visit: Payer: Self-pay

## 2020-09-22 DIAGNOSIS — E785 Hyperlipidemia, unspecified: Secondary | ICD-10-CM

## 2020-09-22 LAB — LIPID PANEL
Cholesterol: 150 mg/dL (ref 0–200)
HDL: 76.9 mg/dL (ref >39.00)
LDL Cholesterol: 45 mg/dL (ref 0–99)
NonHDL: 72.62
Total CHOL/HDL Ratio: 2
Triglycerides: 138 mg/dL (ref 0.0–149.0)
VLDL: 27.6 mg/dL (ref 0.0–40.0)

## 2020-09-22 LAB — HEPATIC FUNCTION PANEL
ALT: 14 U/L (ref 0–35)
AST: 21 U/L (ref 0–37)
Albumin: 4.4 g/dL (ref 3.5–5.2)
Alkaline Phosphatase: 58 U/L (ref 39–117)
Bilirubin, Direct: 0.2 mg/dL (ref 0.0–0.3)
Total Bilirubin: 1.4 mg/dL — ABNORMAL HIGH (ref 0.2–1.2)
Total Protein: 6.8 g/dL (ref 6.0–8.3)

## 2020-10-04 DIAGNOSIS — L603 Nail dystrophy: Secondary | ICD-10-CM | POA: Diagnosis not present

## 2020-10-04 DIAGNOSIS — Z1283 Encounter for screening for malignant neoplasm of skin: Secondary | ICD-10-CM | POA: Diagnosis not present

## 2020-10-04 DIAGNOSIS — D225 Melanocytic nevi of trunk: Secondary | ICD-10-CM | POA: Diagnosis not present

## 2020-10-31 ENCOUNTER — Other Ambulatory Visit: Payer: Self-pay | Admitting: Primary Care

## 2020-10-31 ENCOUNTER — Ambulatory Visit
Admission: RE | Admit: 2020-10-31 | Discharge: 2020-10-31 | Disposition: A | Discharge status: Home / Self Care | Payer: Medicare Other | Source: Ambulatory Visit | Attending: Primary Care | Admitting: Primary Care

## 2020-10-31 ENCOUNTER — Other Ambulatory Visit: Payer: Self-pay

## 2020-10-31 DIAGNOSIS — Z1231 Encounter for screening mammogram for malignant neoplasm of breast: Secondary | ICD-10-CM | POA: Diagnosis not present

## 2020-10-31 DIAGNOSIS — M1990 Unspecified osteoarthritis, unspecified site: Secondary | ICD-10-CM

## 2020-10-31 NOTE — Telephone Encounter (Signed)
Ok to refill 

## 2020-10-31 NOTE — Telephone Encounter (Signed)
Refills sent to pharmacy. 

## 2020-11-20 DIAGNOSIS — Z20822 Contact with and (suspected) exposure to covid-19: Secondary | ICD-10-CM | POA: Diagnosis not present

## 2021-01-28 ENCOUNTER — Other Ambulatory Visit: Payer: Self-pay | Admitting: Primary Care

## 2021-01-28 DIAGNOSIS — M1990 Unspecified osteoarthritis, unspecified site: Secondary | ICD-10-CM

## 2021-01-29 NOTE — Telephone Encounter (Signed)
Ok to refill 

## 2021-01-31 DIAGNOSIS — D225 Melanocytic nevi of trunk: Secondary | ICD-10-CM | POA: Diagnosis not present

## 2021-01-31 DIAGNOSIS — Z1283 Encounter for screening for malignant neoplasm of skin: Secondary | ICD-10-CM | POA: Diagnosis not present

## 2021-01-31 NOTE — Telephone Encounter (Signed)
Refills sent to pharmacy. 

## 2021-02-06 DIAGNOSIS — H401111 Primary open-angle glaucoma, right eye, mild stage: Secondary | ICD-10-CM | POA: Diagnosis not present

## 2021-02-14 ENCOUNTER — Other Ambulatory Visit: Payer: Self-pay | Admitting: Primary Care

## 2021-02-14 DIAGNOSIS — D649 Anemia, unspecified: Secondary | ICD-10-CM

## 2021-02-14 DIAGNOSIS — E785 Hyperlipidemia, unspecified: Secondary | ICD-10-CM

## 2021-03-01 DIAGNOSIS — H6063 Unspecified chronic otitis externa, bilateral: Secondary | ICD-10-CM | POA: Diagnosis not present

## 2021-03-01 DIAGNOSIS — H6983 Other specified disorders of Eustachian tube, bilateral: Secondary | ICD-10-CM | POA: Diagnosis not present

## 2021-03-02 ENCOUNTER — Ambulatory Visit (INDEPENDENT_AMBULATORY_CARE_PROVIDER_SITE_OTHER): Payer: Medicare Other

## 2021-03-02 ENCOUNTER — Other Ambulatory Visit (INDEPENDENT_AMBULATORY_CARE_PROVIDER_SITE_OTHER): Payer: Medicare Other

## 2021-03-02 ENCOUNTER — Other Ambulatory Visit: Payer: Self-pay

## 2021-03-02 DIAGNOSIS — E785 Hyperlipidemia, unspecified: Secondary | ICD-10-CM | POA: Diagnosis not present

## 2021-03-02 DIAGNOSIS — D649 Anemia, unspecified: Secondary | ICD-10-CM | POA: Diagnosis not present

## 2021-03-02 DIAGNOSIS — Z0000 Encounter for general adult medical examination without abnormal findings: Secondary | ICD-10-CM

## 2021-03-02 LAB — CBC
HCT: 44 % (ref 36.0–46.0)
Hemoglobin: 14.4 g/dL (ref 12.0–15.0)
MCHC: 32.7 g/dL (ref 30.0–36.0)
MCV: 91.1 fl (ref 78.0–100.0)
Platelets: 219 10*3/uL (ref 150.0–400.0)
RBC: 4.83 Mil/uL (ref 3.87–5.11)
RDW: 12.6 % (ref 11.5–15.5)
WBC: 6.6 10*3/uL (ref 4.0–10.5)

## 2021-03-02 LAB — LIPID PANEL
Cholesterol: 140 mg/dL (ref 0–200)
HDL: 72.8 mg/dL (ref >39.00)
LDL Cholesterol: 46 mg/dL (ref 0–99)
NonHDL: 67.02
Total CHOL/HDL Ratio: 2
Triglycerides: 104 mg/dL (ref 0.0–149.0)
VLDL: 20.8 mg/dL (ref 0.0–40.0)

## 2021-03-02 LAB — COMPREHENSIVE METABOLIC PANEL
ALT: 15 U/L (ref 0–35)
AST: 21 U/L (ref 0–37)
Albumin: 4.5 g/dL (ref 3.5–5.2)
Alkaline Phosphatase: 68 U/L (ref 39–117)
BUN: 17 mg/dL (ref 6–23)
CO2: 29 mEq/L (ref 19–32)
Calcium: 9.6 mg/dL (ref 8.4–10.5)
Chloride: 103 mEq/L (ref 96–112)
Creatinine, Ser: 0.65 mg/dL (ref 0.40–1.20)
GFR: 84.5 mL/min (ref >60.00)
Glucose, Bld: 86 mg/dL (ref 70–99)
Potassium: 4.4 mEq/L (ref 3.5–5.1)
Sodium: 138 mEq/L (ref 135–145)
Total Bilirubin: 1.3 mg/dL — ABNORMAL HIGH (ref 0.2–1.2)
Total Protein: 7 g/dL (ref 6.0–8.3)

## 2021-03-02 NOTE — Progress Notes (Signed)
Subjective:   Ann Estes is a 80 y.o. female who presents for Medicare Annual (Subsequent) preventive examination.  Review of Systems: N/A     I connected with the patient today by telephone and verified that I am speaking with the correct person using two identifiers. Location patient: home Location nurse: work Persons participating in the telephone visit: patient, nurse.   I discussed the limitations, risks, security and privacy concerns of performing an evaluation and management service by telephone and the availability of in person appointments. I also discussed with the patient that there may be a patient responsible charge related to this service. The patient expressed understanding and verbally consented to this telephonic visit.        Cardiac Risk Factors include: advanced age (>36men, >80 women);Other (see comment), Risk factor comments: hyperlipidemia     Objective:    Today's Vitals   There is no height or weight on file to calculate BMI.  Advanced Directives 03/02/2021 02/29/2020 02/04/2018  Does Patient Have a Medical Advance Directive? Yes Yes Yes  Type of Paramedic of [place];Living will [place];Living will [place] in Chart? No - copy requested No - copy requested No - copy requested    Current Medications (verified) Outpatient Encounter Medications as of 03/02/2021  Medication Sig  . Green Tea, Camillia sinensis, (GREEN TEA PO) Take by mouth.   . latanoprost (XALATAN) 0.005 % ophthalmic solution Place 1 drop into both eyes at bedtime.   . meloxicam (MOBIC) 7.5 MG tablet TAKE 1 TABLET BY MOUTH 2 TIMES DAILY AS NEEDED FOR PAIN.  . metoprolol succinate (TOPROL-XL) 25 MG 24 hr tablet TAKE 1 TABLET BY MOUTH EVERY DAY  . rosuvastatin (CRESTOR) 5 MG tablet Take 1 tablet (5 mg total) by mouth every evening. For cholesterol.  . Calcium Carbonate-Vitamin D3 (CALCIUM  600-D) 600-400 MG-UNIT TABS Take 1 tablet by mouth 2 (two) times daily.    No facility-administered encounter medications on file as of 03/02/2021.    Allergies (verified) Patient has no known allergies.   History: Past Medical History:  Diagnosis Date  . BPPV (benign paroxysmal positional vertigo)   . Chicken pox   . Glaucoma   . Hyperlipidemia   . Osteoarthritis   . Palpitations 10/13/2018   Past Surgical History:  Procedure Laterality Date  . AUGMENTATION MAMMAPLASTY Bilateral ?   silicone implants now removed   . BREAST IMPLANT REMOVAL    . CATARACT EXTRACTION Bilateral   . CHOLECYSTECTOMY N/A 03/11/2020   Procedure: LAPAROSCOPIC CHOLECYSTECTOMY;  Surgeon: Clovis Riley, MD;  Location: [facility];  Service: General;  Laterality: N/A;  . COSMETIC SURGERY     Eyelids, tummy tuck  . PLACEMENT OF BREAST IMPLANTS    . 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 TOTAL HIP ARTHROPLASTY Left 02/2005  . TRIGGER FINGER RELEASE  11/2016   Thumb   Family History  Problem Relation Age of Onset  . Heart disease Mother   . Cancer Father   . Birth defects Daughter   . Arthritis Son   . Cancer Maternal Grandmother   . Heart attack Maternal Grandfather   . Asthma Sister   . Depression Sister   . Asthma Sister    Social History   Socioeconomic History  . Marital status: Divorced    Spouse name: Not on file  . Number of children: Not on file  . Years of education: Not  on file  . Highest education level: Not on file  Occupational History  . Not on file  Tobacco Use  . Smoking status: Former Research scientist (life sciences)  . Smokeless tobacco: Never Used  Vaping Use  . Vaping Use: Never used  Substance and Sexual Activity  . Alcohol use: Yes    Comment: twice a year  . Drug use: No  . Sexual activity: Not on file  Other Topics Concern  . Not on file  Social History Narrative  . Not on file   Social Determinants of Health   Financial Resource Strain: Low Risk   . Difficulty of Paying Living  Expenses: Not hard at all  Food Insecurity: No Food Insecurity  . Worried About Charity fundraiser in the Last Year: Never true  . Ran Out of Food in the Last Year: Never true  Transportation Needs: No Transportation Needs  . Lack of Transportation (Medical): No  . Lack of Transportation (Non-Medical): No  Physical Activity: Insufficiently Active  . Days of Exercise per Week: 7 days  . Minutes of Exercise per Session: 20 min  Stress: No Stress Concern Present  . Feeling of Stress : Not at all  Social Connections: Not on file    Tobacco Counseling Counseling given: Not Answered   Clinical Intake:  Pre-visit preparation completed: Yes  Pain : No/denies pain     Nutritional Risks: None Diabetes: No  How often do you need to have someone help you when you read instructions, pamphlets, or other written materials from your doctor or pharmacy?: 1 - Never What is the last grade level you completed in school?: bachelors  Diabetic: No Nutrition Risk Assessment:  Has the patient had any N/V/D within the last 2 months?  No  Does the patient have any non-healing wounds?  No  Has the patient had any unintentional weight loss or weight gain?  No   Diabetes:  Is the patient diabetic?  No  If diabetic, was a CBG obtained today?  N/A Did the patient bring in their glucometer from home?  N/A How often do you monitor your CBG's? N/A.   Financial Strains and Diabetes Management:  Are you having any financial strains with the device, your supplies or your medication? N/A.  Does the patient want to be seen by Chronic Care Management for management of their diabetes?  N/A Would the patient like to be referred to a Nutritionist or for Diabetic Management?  N/A   Interpreter Needed?: No  Information entered by :: CJohnson, LPN   Activities of Daily Living In your present state of health, do you have any difficulty performing the following activities: 03/02/2021  Hearing? N  Vision?  N  Difficulty concentrating or making decisions? N  Walking or climbing stairs? N  Dressing or bathing? N  Doing errands, shopping? N  Preparing Food and eating ? N  Using the Toilet? N  In the past six months, have you accidently leaked urine? N  Do you have problems with loss of bowel control? N  Managing your Medications? N  Managing your Finances? N  Housekeeping or managing your Housekeeping? N  Some recent data might be hidden    Patient Care Team: Pleas Koch, NP as PCP - General (Internal Medicine) Belva Crome, MD as PCP - Cardiology (Cardiology) Leandrew Koyanagi, MD as Referring Physician (Ophthalmology)  Indicate any recent Medical Services you may have received from other than [facility] providers in the past year (date may be  approximate).     Assessment:   This is a routine wellness examination for [place].  Hearing/Vision screen  Hearing Screening   125Hz  250Hz  500Hz  1000Hz  2000Hz  3000Hz  4000Hz  6000Hz  8000Hz   Right ear:           Left ear:           Vision Screening Comments: Patient gets annual eye exams  Dietary issues and exercise activities discussed: Current Exercise Habits: The patient does not participate in regular exercise at present, Exercise limited by: None identified  Goals    . Increase physical activity     Starting 02/04/2018, I will continue to walk at least 3 miles daily as weather permits.     . Patient Stated     02/29/2020, I will continue to walk everyday for about 30 minutes to 1 hour.    . Patient Stated     03/02/2021, I will continue to walk daily for about 1 mile.      Depression Screen PHQ 2/9 Scores 03/02/2021 02/29/2020 02/15/2019 02/04/2018  PHQ - 2 Score 0 0 0 0  PHQ- 9 Score 0 0 - 0    Fall Risk Fall Risk  03/02/2021 02/29/2020 02/15/2019 02/04/2018  Falls in the past year? 0 0 0 No  Number falls in past yr: 0 0 0 -  Injury with Fall? 0 0 0 -  Risk for fall due to : Medication side effect Medication side effect - -  Follow  up Falls evaluation completed;Falls prevention discussed Falls evaluation completed;Falls prevention discussed Falls evaluation completed -    FALL RISK PREVENTION PERTAINING TO THE HOME:  Any stairs in or around the home? Yes  If so, are there any without handrails? No  Home free of loose throw rugs in walkways, pet beds, electrical cords, etc? Yes  Adequate lighting in your home to reduce risk of falls? Yes   ASSISTIVE DEVICES UTILIZED TO PREVENT FALLS:  Life alert? No  Use of a cane, walker or w/c? No  Grab bars in the bathroom? No  Shower chair or bench in shower? No  Elevated toilet seat or a handicapped toilet? No   TIMED UP AND GO:  Was the test performed? No . Telephone visit   Cognitive Function: MMSE - Mini Mental State Exam 03/02/2021 02/29/2020 02/04/2018  Orientation to time 5 5 5   Orientation to Place 5 5 5   Registration 3 3 3   Attention/ Calculation 5 5 0  Recall 3 3 3   Language- name 2 objects - - 0  Language- repeat 1 1 1   Language- follow 3 step command - - 3  Language- read & follow direction - - 0  Write a sentence - - 0  Copy design - - 0  Total score - - 20  Mini Cog  Mini-Cog screen was completed. Maximum score is 22. A value of 0 denotes this part of the MMSE was not completed or the patient failed this part of the Mini-Cog screening.       Immunizations Immunization History  Administered Date(s) Administered  . Fluad Quad(high Dose 65+) 08/06/2019, 09/15/2020  . Influenza, High Dose Seasonal PF 10/05/2018  . Influenza,inj,Quad PF,6+ Mos 08/11/2017  . PFIZER Comirnaty(Gray Top)Covid-19 Tri-Sucrose Vaccine 02/21/2021  . PFIZER(Purple Top)SARS-COV-2 Vaccination 12/04/2019, 12/25/2019, 08/25/2020  . Pneumococcal Conjugate-13 11/13/2013  . Pneumococcal Polysaccharide-23 12/22/2014  . Td 07/23/2010  . Tdap 03/07/2020  . Zoster 06/27/2009  . Zoster Recombinat (Shingrix) 05/19/2018, 09/26/2018    TDAP status: Up to  date  Flu Vaccine status: Up  to date  Pneumococcal vaccine status: Up to date  Covid-19 vaccine status: Completed vaccines  Qualifies for Shingles Vaccine? Yes   Zostavax completed Yes   Shingrix Completed?: Yes  Screening Tests Health Maintenance  Topic Date Due  . Hepatitis C Screening  Never done  . INFLUENZA VACCINE  06/25/2021  . TETANUS/TDAP  03/07/2030  . DEXA SCAN  Completed  . COVID-19 Vaccine  Completed  . PNA vac Low Risk Adult  Completed  . HPV VACCINES  Aged Out    Health Maintenance  Health Maintenance Due  Topic Date Due  . Hepatitis C Screening  Never done    Colorectal cancer screening: No longer required.   Mammogram status: Completed 10/31/2020. Repeat every year  Bone Density status: Completed 06/30/2019. Results reflect: Bone density results: OSTEOPOROSIS. Repeat every 2 years.  Lung Cancer Screening: (Low Dose CT Chest recommended if Age 66-80 years, 30 pack-year currently smoking OR have quit w/in 15years.) does not qualify.    Additional Screening:  Hepatitis C Screening: does qualify; Completed due  Vision Screening: Recommended annual ophthalmology exams for early detection of glaucoma and other disorders of the eye. Is the patient up to date with their annual eye exam?  Yes  Who is the provider or what is the name of the office in which the patient attends annual eye exams? Dr. Wallace Going  If pt is not established with a provider, would they like to be referred to a provider to establish care? No .   Dental Screening: Recommended annual dental exams for proper oral hygiene  Community Resource Referral / Chronic Care Management: CRR required this visit?  No   CCM required this visit?  No      Plan:     I have personally reviewed and noted the following in the patient's chart:   . Medical and social history . Use of alcohol, tobacco or illicit drugs  . Current medications and supplements . Functional ability and status . Nutritional status . Physical  activity . Advanced directives . List of other physicians . Hospitalizations, surgeries, and ER visits in previous 12 months . Vitals . Screenings to include cognitive, depression, and falls . Referrals and appointments  In addition, I have reviewed and discussed with patient certain preventive protocols, quality metrics, and best practice recommendations. A written personalized care plan for preventive services as well as general preventive health recommendations were provided to patient.   Due to this being a telephonic visit, the after visit summary with patients personalized plan was offered to patient via office or my-chart. Patient preferred to pick up at office at next visit or via mychart.   Andrez Grime, LPN   05/27/4286

## 2021-03-02 NOTE — Progress Notes (Signed)
PCP notes:  Health Maintenance: No gaps noted   Abnormal Screenings: none   Patient concerns: none   Nurse concerns: none   Next PCP appt.: 03/08/2021 @ 12:20 pm

## 2021-03-02 NOTE — Patient Instructions (Signed)
[name] , Thank you for taking time to come for your Medicare Wellness Visit. I appreciate your ongoing commitment to your health goals. Please review the following plan we discussed and let me know if I can assist you in the future.   Screening recommendations/referrals: Colonoscopy: no longer required  Mammogram: Up to date, completed 10/31/2020, due 10/2021 Bone Density: Up to date, completed 06/30/2019, due 06/2021 Recommended yearly ophthalmology/optometry visit for glaucoma screening and checkup Recommended yearly dental visit for hygiene and checkup  Vaccinations: Influenza vaccine: Up to date, completed 09/15/2020, due 06/2021 Pneumococcal vaccine: Completed series Tdap vaccine: Up to date, completed 03/07/2020, due 02/2030 Shingles vaccine: Completed series   Covid-19:Completed series  Advanced directives: Please bring a copy of your POA (Power of [place]) and/or Living Will to your next appointment.   Conditions/risks identified: hyperlipidemia   Next appointment: Follow up in one year for your annual wellness visit    Preventive Care 65 Years and Older, Female Preventive care refers to lifestyle choices and visits with your health care provider that can promote health and wellness. What does preventive care include?  A yearly physical exam. This is also called an annual well check.  Dental exams once or twice a year.  Routine eye exams. Ask your health care provider how often you should have your eyes checked.  Personal lifestyle choices, including:  Daily care of your teeth and gums.  Regular physical activity.  Eating a healthy diet.  Avoiding tobacco and drug use.  Limiting alcohol use.  Practicing safe sex.  Taking low-dose aspirin every day.  Taking vitamin and mineral supplements as recommended by your health care provider. What happens during an annual well check? The services and screenings done by your health care provider during your annual well  check will depend on your age, overall health, lifestyle risk factors, and family history of disease. Counseling  Your health care provider may ask you questions about your:  Alcohol use.  Tobacco use.  Drug use.  Emotional well-being.  Home and relationship well-being.  Sexual activity.  Eating habits.  History of falls.  Memory and ability to understand (cognition).  Work and work Statistician.  Reproductive health. Screening  You may have the following tests or measurements:  Height, weight, and BMI.  Blood pressure.  Lipid and cholesterol levels. These may be checked every 5 years, or more frequently if you are over 64 years old.  Skin check.  Lung cancer screening. You may have this screening every year starting at age 53 if you have a 30-pack-year history of smoking and currently smoke or have quit within the past 15 years.  Fecal occult blood test (FOBT) of the stool. You may have this test every year starting at age 78.  Flexible sigmoidoscopy or colonoscopy. You may have a sigmoidoscopy every 5 years or a colonoscopy every 10 years starting at age 73.  Hepatitis C blood test.  Hepatitis B blood test.  Sexually transmitted disease (STD) testing.  Diabetes screening. This is done by checking your blood sugar (glucose) after you have not eaten for a while (fasting). You may have this done every 1-3 years.  Bone density scan. This is done to screen for osteoporosis. You may have this done starting at age 18.  Mammogram. This may be done every 1-2 years. Talk to your health care provider about how often you should have regular mammograms. Talk with your health care provider about your test results, treatment options, and if necessary, the  need for more tests. Vaccines  Your health care provider may recommend certain vaccines, such as:  Influenza vaccine. This is recommended every year.  Tetanus, diphtheria, and acellular pertussis (Tdap, Td) vaccine. You  may need a Td booster every 10 years.  Zoster vaccine. You may need this after age 79.  Pneumococcal 13-valent conjugate (PCV13) vaccine. One dose is recommended after age 29.  Pneumococcal polysaccharide (PPSV23) vaccine. One dose is recommended after age 84. Talk to your health care provider about which screenings and vaccines you need and how often you need them. This information is not intended to replace advice given to you by your health care provider. Make sure you discuss any questions you have with your health care provider. Document Released: 12/08/2015 Document Revised: 07/31/2016 Document Reviewed: 09/12/2015 Elsevier Interactive Patient Education  2017 [place] Prevention in the Home Falls can cause injuries. They can happen to people of all ages. There are many things you can do to make your home safe and to help prevent falls. What can I do on the outside of my home?  Regularly fix the edges of walkways and driveways and fix any cracks.  Remove anything that might make you trip as you walk through a door, such as a raised step or threshold.  Trim any bushes or trees on the path to your home.  Use bright outdoor lighting.  Clear any walking paths of anything that might make someone trip, such as rocks or tools.  Regularly check to see if handrails are loose or broken. Make sure that both sides of any steps have handrails.  Any raised decks and porches should have guardrails on the edges.  Have any leaves, snow, or ice cleared regularly.  Use sand or salt on walking paths during winter.  Clean up any spills in your garage right away. This includes oil or grease spills. What can I do in the bathroom?  Use night lights.  Install grab bars by the toilet and in the tub and shower. Do not use towel bars as grab bars.  Use non-skid mats or decals in the tub or shower.  If you need to sit down in the shower, use a plastic, non-slip stool.  Keep the floor  dry. Clean up any water that spills on the floor as soon as it happens.  Remove soap buildup in the tub or shower regularly.  Attach bath mats securely with double-sided non-slip rug tape.  Do not have throw rugs and other things on the floor that can make you trip. What can I do in the bedroom?  Use night lights.  Make sure that you have a light by your bed that is easy to reach.  Do not use any sheets or blankets that are too big for your bed. They should not hang down onto the floor.  Have a firm chair that has side arms. You can use this for support while you get dressed.  Do not have throw rugs and other things on the floor that can make you trip. What can I do in the kitchen?  Clean up any spills right away.  Avoid walking on wet floors.  Keep items that you use a lot in easy-to-reach places.  If you need to reach something above you, use a strong step stool that has a grab bar.  Keep electrical cords out of the way.  Do not use floor polish or wax that makes floors slippery. If you must use wax,  use non-skid floor wax.  Do not have throw rugs and other things on the floor that can make you trip. What can I do with my stairs?  Do not leave any items on the stairs.  Make sure that there are handrails on both sides of the stairs and use them. Fix handrails that are broken or loose. Make sure that handrails are as long as the stairways.  Check any carpeting to make sure that it is firmly attached to the stairs. Fix any carpet that is loose or worn.  Avoid having throw rugs at the top or bottom of the stairs. If you do have throw rugs, attach them to the floor with carpet tape.  Make sure that you have a light switch at the top of the stairs and the bottom of the stairs. If you do not have them, ask someone to add them for you. What else can I do to help prevent falls?  Wear shoes that:  Do not have high heels.  Have rubber bottoms.  Are comfortable and fit you  well.  Are closed at the toe. Do not wear sandals.  If you use a stepladder:  Make sure that it is fully opened. Do not climb a closed stepladder.  Make sure that both sides of the stepladder are locked into place.  Ask someone to hold it for you, if possible.  Clearly mark and make sure that you can see:  Any grab bars or handrails.  First and last steps.  Where the edge of each step is.  Use tools that help you move around (mobility aids) if they are needed. These include:  Canes.  Walkers.  Scooters.  Crutches.  Turn on the lights when you go into a dark area. Replace any light bulbs as soon as they burn out.  Set up your furniture so you have a clear path. Avoid moving your furniture around.  If any of your floors are uneven, fix them.  If there are any pets around you, be aware of where they are.  Review your medicines with your doctor. Some medicines can make you feel dizzy. This can increase your chance of falling. Ask your doctor what other things that you can do to help prevent falls. This information is not intended to replace advice given to you by your health care provider. Make sure you discuss any questions you have with your health care provider. Document Released: 09/07/2009 Document Revised: 04/18/2016 Document Reviewed: 12/16/2014 Elsevier Interactive Patient Education  2017 [employer].

## 2021-03-06 ENCOUNTER — Encounter: Payer: Medicare Other | Admitting: Primary Care

## 2021-03-08 ENCOUNTER — Other Ambulatory Visit: Payer: Self-pay

## 2021-03-08 ENCOUNTER — Ambulatory Visit (INDEPENDENT_AMBULATORY_CARE_PROVIDER_SITE_OTHER): Payer: Medicare Other | Admitting: Primary Care

## 2021-03-08 ENCOUNTER — Encounter: Payer: Self-pay | Admitting: Primary Care

## 2021-03-08 VITALS — BP 124/62 | HR 82 | Temp 97.6°F | Ht 62.0 in | Wt 130.0 lb

## 2021-03-08 DIAGNOSIS — Z96643 Presence of artificial hip joint, bilateral: Secondary | ICD-10-CM

## 2021-03-08 DIAGNOSIS — E785 Hyperlipidemia, unspecified: Secondary | ICD-10-CM | POA: Diagnosis not present

## 2021-03-08 DIAGNOSIS — M159 Polyosteoarthritis, unspecified: Secondary | ICD-10-CM

## 2021-03-08 DIAGNOSIS — E2839 Other primary ovarian failure: Secondary | ICD-10-CM | POA: Diagnosis not present

## 2021-03-08 DIAGNOSIS — M1990 Unspecified osteoarthritis, unspecified site: Secondary | ICD-10-CM | POA: Diagnosis not present

## 2021-03-08 DIAGNOSIS — H409 Unspecified glaucoma: Secondary | ICD-10-CM | POA: Diagnosis not present

## 2021-03-08 DIAGNOSIS — R002 Palpitations: Secondary | ICD-10-CM

## 2021-03-08 MED ORDER — MELOXICAM 7.5 MG PO TABS: ORAL_TABLET | ORAL | 0 refills | Status: DC

## 2021-03-08 NOTE — Progress Notes (Signed)
Subjective:    Patient ID: Ann Estes, female    DOB: 1941-01-26, 80 y.o.   MRN: [id number]  HPI  Ann Estes is a very pleasant 80 y.o. female who presents today for follow up of chronic medical conditions and MWV Part 2.  Overall doing well.  She is taking Meloxicam 7.5 mg once daily, doing well on this regimen.  CT abdomen/pelvis completed in April 2021 for abdominal pain, diagnosed with cholelithiasis. CT scan also revealed colonic diverticulosis, periuterine and adnexal vascularity, lucencies adjacent to both acetabular cups/highly suspicious for particle disease. She notified us last year that she was going to follow up with her orthopedic specialist, she has yet to do so but plans on doing so soon.   Immunizations: -Tetanus: 2021 -Influenza: Completed this season  -Covid-19: Completed 3 vaccines, last vaccine was 02/21/21 -Shingles: Completed shingrix and zostavax -Pneumonia: Prevnar in 2014 and pneumovax in 2016  Mammogram: December 2021 Dexa: 2020, osteoporosis Colonoscopy: Declines given age.  BP Readings from Last 3 Encounters:  03/08/21 124/62  06/23/20 (!) 112/62  03/12/20 (!) 121/54       Review of Systems  Respiratory: Negative for shortness of breath.   Cardiovascular: Negative for chest pain.  Gastrointestinal: Negative for constipation and diarrhea.  Musculoskeletal: Positive for arthralgias.  Neurological: Negative for dizziness and headaches.         Past Medical History:  Diagnosis Date  . BPPV (benign paroxysmal positional vertigo)   . Chicken pox   . Glaucoma   . Hyperlipidemia   . Osteoarthritis   . Palpitations 10/13/2018    Social History   Socioeconomic History  . Marital status: Divorced    Spouse name: Not on file  . Number of children: Not on file  . Years of education: Not on file  . Highest education level: Not on file  Occupational History  . Not on file  Tobacco Use  . Smoking status: Former Research scientist (life sciences)  . Smokeless  tobacco: Never Used  Vaping Use  . Vaping Use: Never used  Substance and Sexual Activity  . Alcohol use: Yes    Comment: twice a year  . Drug use: No  . Sexual activity: Not on file  Other Topics Concern  . Not on file  Social History Narrative  . Not on file   Social Determinants of Health   Financial Resource Strain: Low Risk   . Difficulty of Paying Living Expenses: Not hard at all  Food Insecurity: No Food Insecurity  . Worried About Charity fundraiser in the Last Year: Never true  . Ran Out of Food in the Last Year: Never true  Transportation Needs: No Transportation Needs  . Lack of Transportation (Medical): No  . Lack of Transportation (Non-Medical): No  Physical Activity: Insufficiently Active  . Days of Exercise per Week: 7 days  . Minutes of Exercise per Session: 20 min  Stress: No Stress Concern Present  . Feeling of Stress : Not at all  Social Connections: Not on file  Intimate Partner Violence: Not At Risk  . Fear of Current or Ex-Partner: No  . Emotionally Abused: No  . Physically Abused: No  . Sexually Abused: No    Past Surgical History:  Procedure Laterality Date  . AUGMENTATION MAMMAPLASTY Bilateral ?   silicone implants now removed   . BREAST IMPLANT REMOVAL    . CATARACT EXTRACTION Bilateral   . CHOLECYSTECTOMY N/A 03/11/2020   Procedure: LAPAROSCOPIC CHOLECYSTECTOMY;  Surgeon: Clovis Riley, MD;  Location: [facility];  Service: General;  Laterality: N/A;  . COSMETIC SURGERY     Eyelids, tummy tuck  . PLACEMENT OF BREAST IMPLANTS    . 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 TOTAL HIP ARTHROPLASTY Left 02/2005  . TRIGGER FINGER RELEASE  11/2016   Thumb    Family History  Problem Relation Age of Onset  . Heart disease Mother   . Cancer Father   . Birth defects Daughter   . Arthritis Son   . Cancer Maternal Grandmother   . Heart attack Maternal Grandfather   . Asthma Sister   . Depression Sister   . Asthma Sister     No Known  Allergies  Current Outpatient Medications on File Prior to Visit  Medication Sig Dispense Refill  . Green Tea, Camillia sinensis, (GREEN TEA PO) Take by mouth.     . latanoprost (XALATAN) 0.005 % ophthalmic solution Place 1 drop into both eyes at bedtime.   2  . meloxicam (MOBIC) 7.5 MG tablet TAKE 1 TABLET BY MOUTH 2 TIMES DAILY AS NEEDED FOR PAIN. 180 tablet 0  . metoprolol succinate (TOPROL-XL) 25 MG 24 hr tablet TAKE 1 TABLET BY MOUTH EVERY DAY 90 tablet 3  . rosuvastatin (CRESTOR) 5 MG tablet Take 1 tablet (5 mg total) by mouth every evening. For cholesterol. 90 tablet 3   No current facility-administered medications on file prior to visit.    BP 124/62   Pulse 82   Temp 97.6 F (36.4 C) (Temporal)   Ht 5\' 2"  (1.575 m)   Wt 130 lb (59 kg)   SpO2 97%   BMI 23.78 kg/m  Objective:   Physical Exam Cardiovascular:     Rate and Rhythm: Normal rate and regular rhythm.  Pulmonary:     Effort: Pulmonary effort is normal.     Breath sounds: Normal breath sounds.  Musculoskeletal:     Cervical back: Neck supple.  Skin:    General: Skin is warm and dry.  Psychiatric:        Mood and Affect: Mood normal.           Assessment & Plan:      This visit occurred during the SARS-CoV-2 public health emergency.  Safety protocols were in place, including screening questions prior to the visit, additional usage of staff PPE, and extensive cleaning of exam room while observing appropriate contact time as indicated for disinfecting solutions.

## 2021-03-08 NOTE — Assessment & Plan Note (Signed)
Doing well on metoprolol succinate 25 mg.  Follows with cardiology annually.

## 2021-03-08 NOTE — Assessment & Plan Note (Signed)
Follows with ophthalmology, continue Xalatan.

## 2021-03-08 NOTE — Patient Instructions (Addendum)
Continue to work on a healthy diet, exercise regularly.  Complete your bone density scan this year as discussed.  It was a pleasure to see you today!

## 2021-03-08 NOTE — Assessment & Plan Note (Signed)
Completed years ago. CT scan with incidental finding of lucencies bilaterally, discussed this with patient today.  She intends to follow up with her orthopedic doctor, offered referral to local orthopedic today and she kindly declined.   She will "schedule when I'm ready".

## 2021-03-08 NOTE — Assessment & Plan Note (Signed)
Stable, doing well on Meloxicam 7.5 mg BID with improvement. Repeat renal function pending.  Continue Meloxicam 7.5 mg BID.

## 2021-03-08 NOTE — Assessment & Plan Note (Signed)
Doing well on Crestor 5 mg, recent lipid panel at goal. Continue Crestor 5 mg.

## 2021-05-04 ENCOUNTER — Other Ambulatory Visit: Payer: Self-pay | Admitting: Primary Care

## 2021-05-04 DIAGNOSIS — M1990 Unspecified osteoarthritis, unspecified site: Secondary | ICD-10-CM

## 2021-05-04 DIAGNOSIS — M159 Polyosteoarthritis, unspecified: Secondary | ICD-10-CM

## 2021-05-04 NOTE — Telephone Encounter (Signed)
Can we find out how much she's been taking? 1 tablet of the Meloxicam 7.5 mg daily or two daily?  If only one then okay to send in Meloxicam 7.5 mg. Take 1 tablet by mouth once daily as needed for pain. #90, no refills.  If not then send back to me and I'll change to Meloxicam 15 mg.

## 2021-05-04 NOTE — Telephone Encounter (Signed)
Received fax from [facility] stating insurance will only cover 1 tab daily of the Meloxicam. Please send Rx for higher strength if appropriate.

## 2021-06-05 DIAGNOSIS — D225 Melanocytic nevi of trunk: Secondary | ICD-10-CM | POA: Diagnosis not present

## 2021-06-05 DIAGNOSIS — Z1283 Encounter for screening for malignant neoplasm of skin: Secondary | ICD-10-CM | POA: Diagnosis not present

## 2021-06-28 ENCOUNTER — Other Ambulatory Visit: Payer: Self-pay | Admitting: Primary Care

## 2021-06-28 ENCOUNTER — Other Ambulatory Visit: Payer: Self-pay | Admitting: Interventional Cardiology

## 2021-06-28 DIAGNOSIS — E785 Hyperlipidemia, unspecified: Secondary | ICD-10-CM

## 2021-06-28 NOTE — Telephone Encounter (Signed)
Ok to give one 90 day refill with a note that future refills will need to come from PCP.  Thanks!

## 2021-07-14 IMAGING — MG DIGITAL SCREENING BILAT W/ TOMO W/ CAD
6 of 10 series · 6 of 30 positions shown · non-contrast
Comparison: Previous exam(s).

CLINICAL DATA: Screening.

EXAM:
DIGITAL SCREENING BILATERAL MAMMOGRAM WITH TOMO AND CAD

[L CC synth-2D]
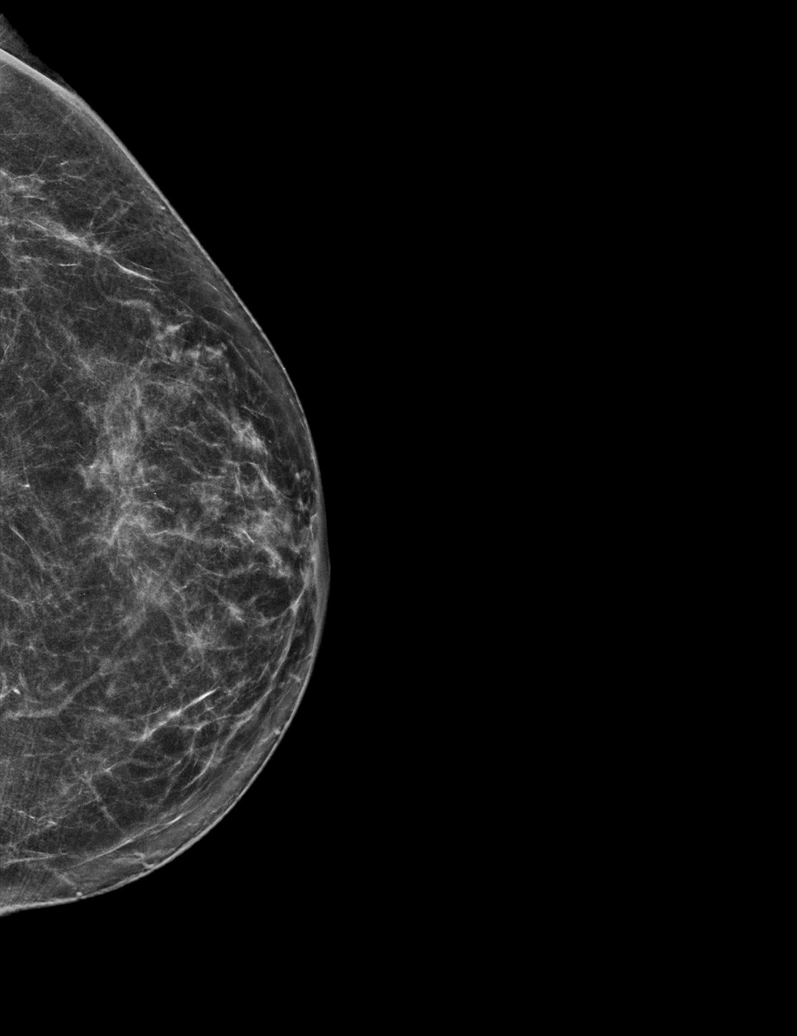

[L MLO synth-2D]
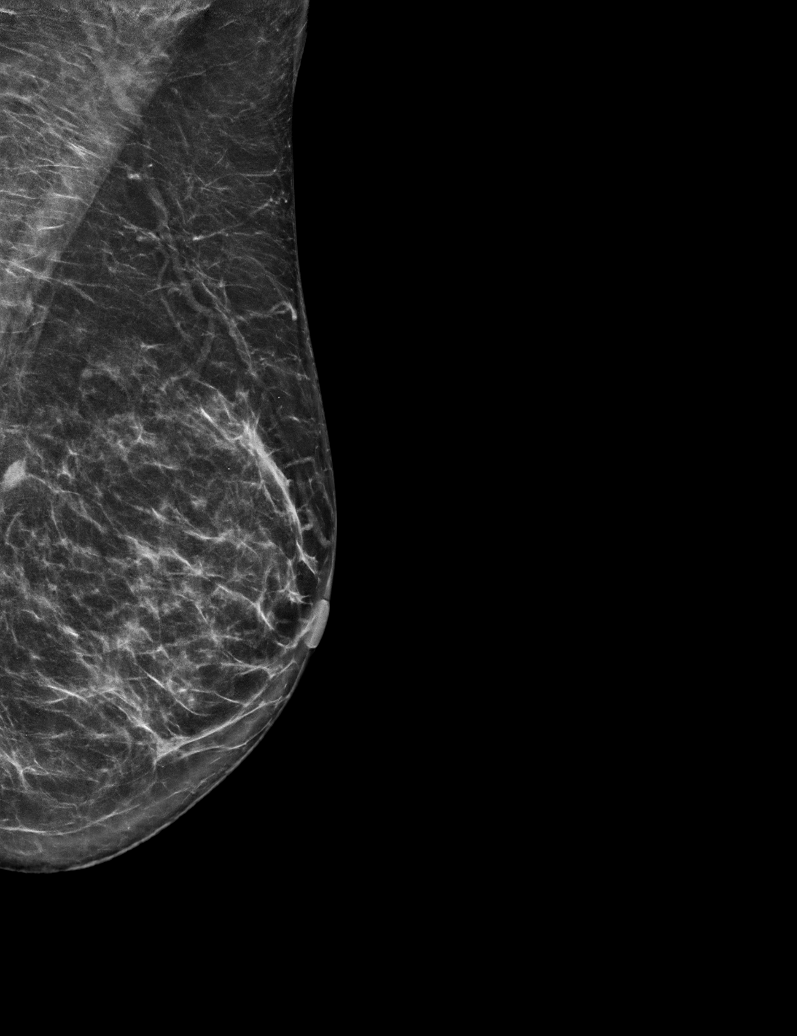

[R MLO synth-2D (1 of 2)]
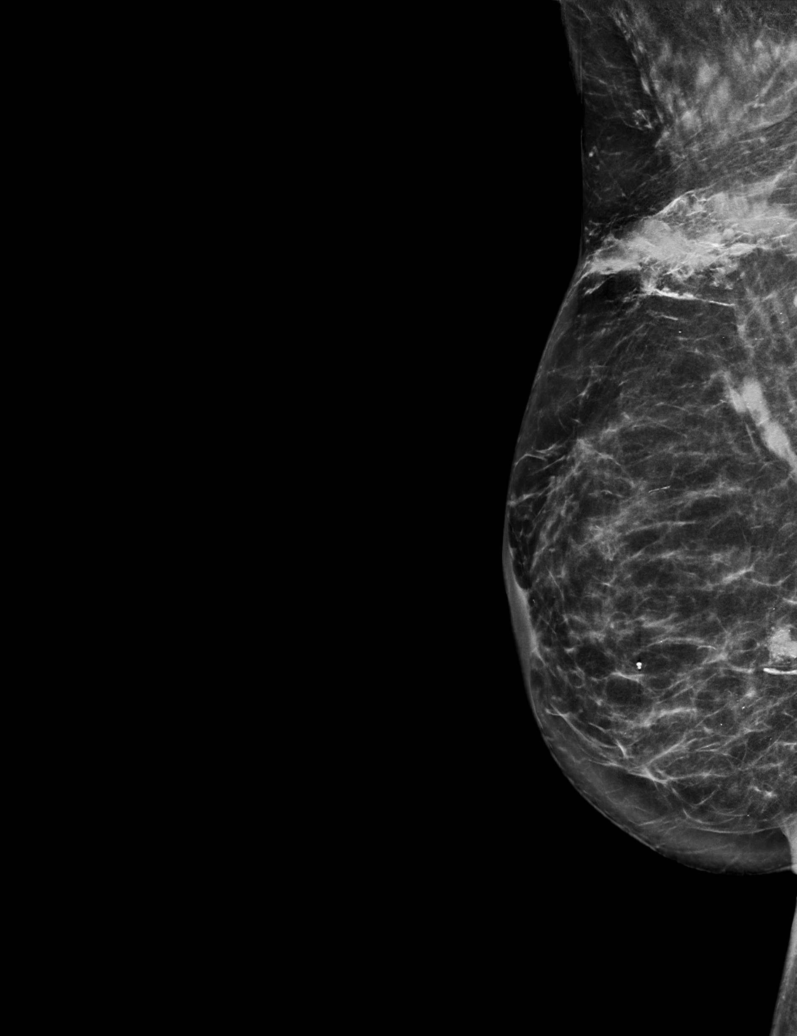

[R CC synth-2D]
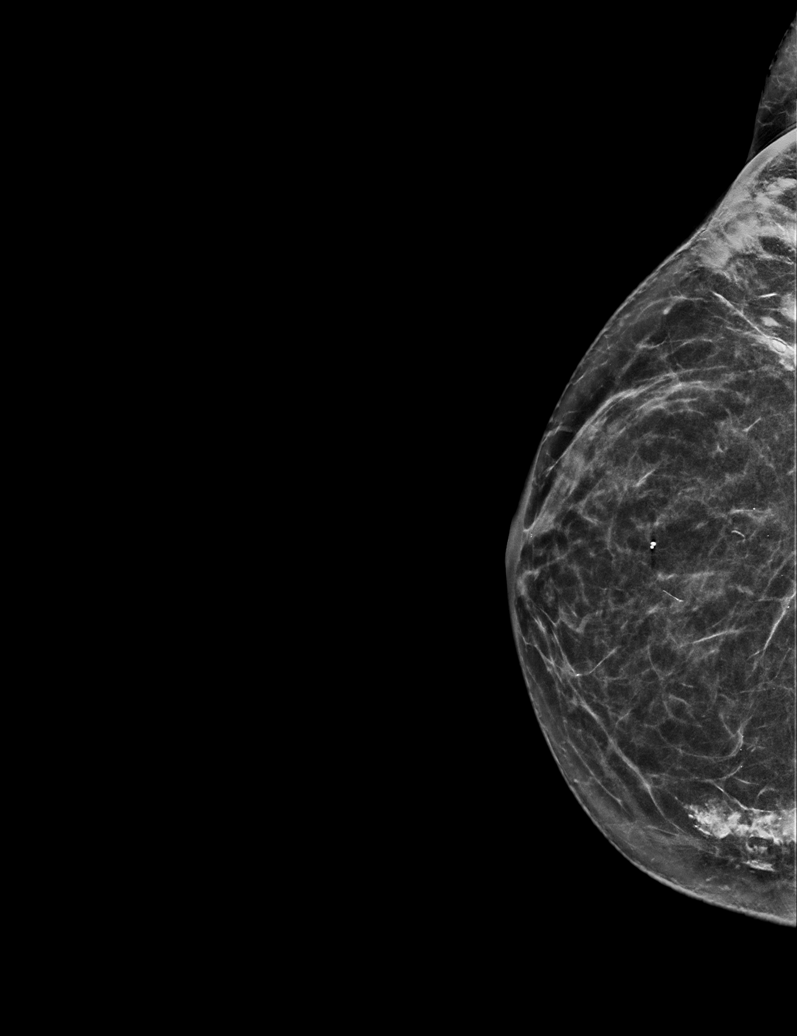

[R MLO synth-2D (2 of 2)]
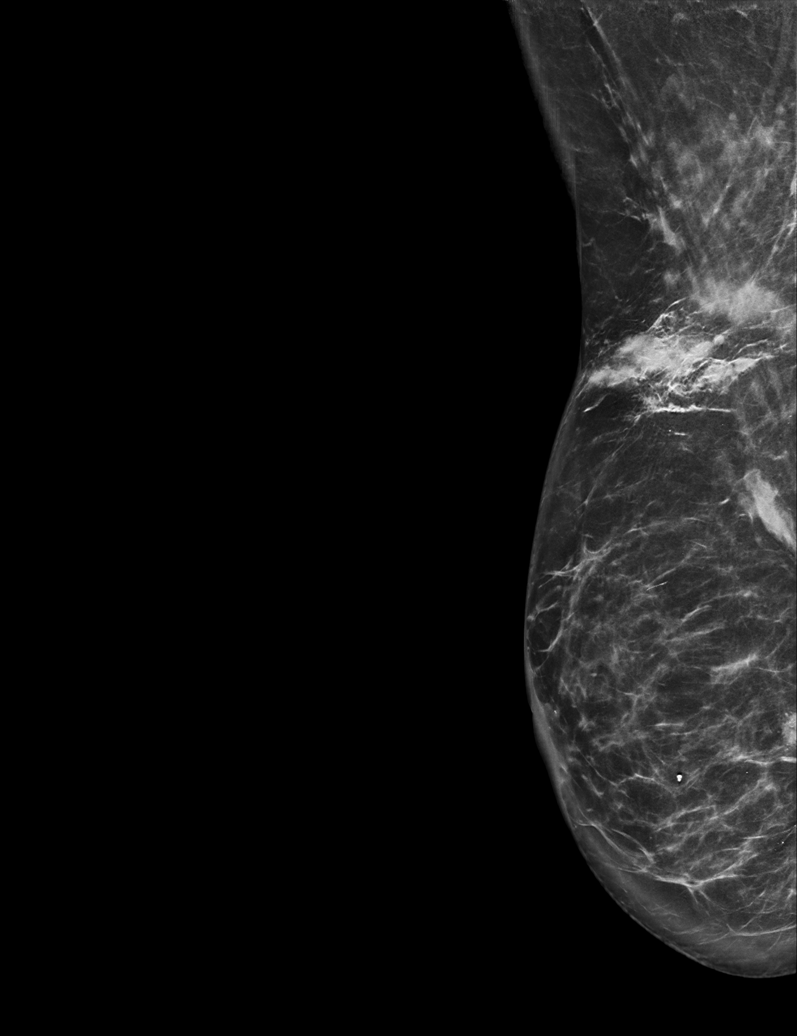

[R CC tomo · tomo slice 29/56.0]
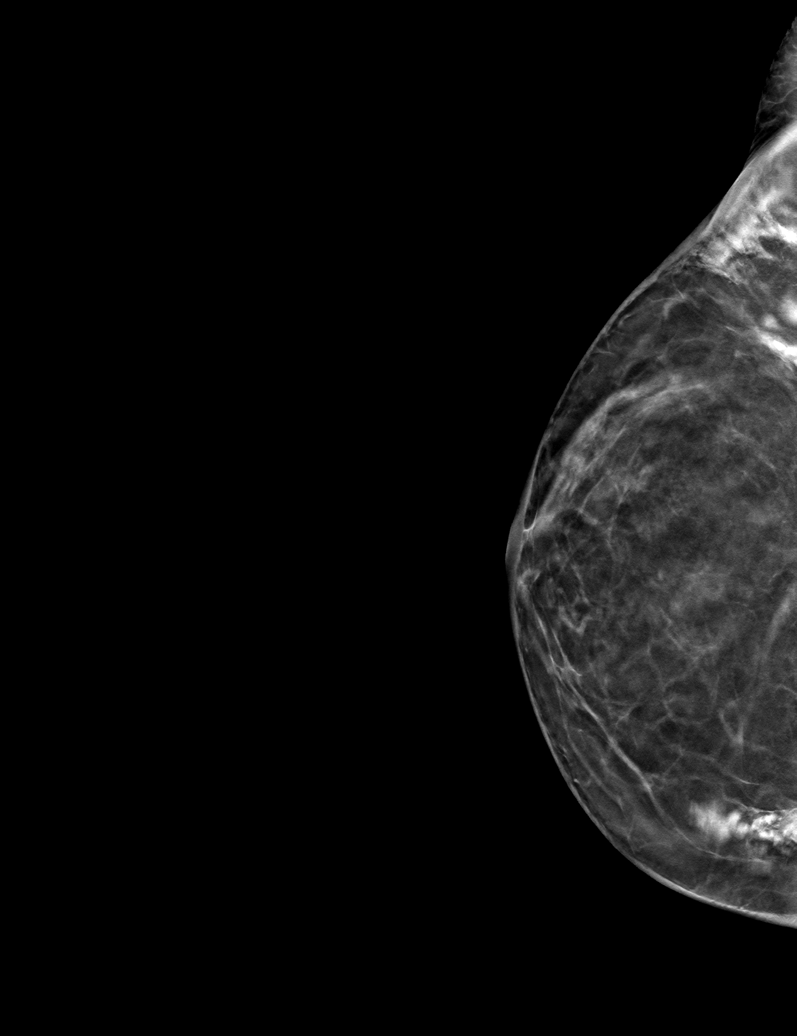

[6 of 30 positions shown; findings below may reference images not displayed]

ACR Breast Density Category b: There are scattered areas of
fibroglandular density.
FINDINGS: There are no findings suspicious for malignancy. Images were
processed with CAD.
IMPRESSION: No mammographic evidence of malignancy. A result letter of this
screening mammogram will be mailed directly to the patient.

RECOMMENDATION:
Screening mammogram in one year. (Code:[id number])

BI-RADS CATEGORY  1: Negative.

## 2021-07-18 ENCOUNTER — Ambulatory Visit
Admission: RE | Admit: 2021-07-18 | Discharge: 2021-07-18 | Disposition: A | Discharge status: Home / Self Care | Payer: Medicare Other | Source: Ambulatory Visit | Attending: Primary Care | Admitting: Primary Care

## 2021-07-18 ENCOUNTER — Other Ambulatory Visit: Payer: Self-pay

## 2021-07-18 DIAGNOSIS — E2839 Other primary ovarian failure: Secondary | ICD-10-CM | POA: Diagnosis not present

## 2021-07-18 DIAGNOSIS — Z780 Asymptomatic menopausal state: Secondary | ICD-10-CM | POA: Diagnosis not present

## 2021-07-18 DIAGNOSIS — M810 Age-related osteoporosis without current pathological fracture: Secondary | ICD-10-CM | POA: Diagnosis not present

## 2021-08-07 DIAGNOSIS — H401122 Primary open-angle glaucoma, left eye, moderate stage: Secondary | ICD-10-CM | POA: Diagnosis not present

## 2021-08-07 DIAGNOSIS — H401111 Primary open-angle glaucoma, right eye, mild stage: Secondary | ICD-10-CM | POA: Diagnosis not present

## 2021-08-14 DIAGNOSIS — H401122 Primary open-angle glaucoma, left eye, moderate stage: Secondary | ICD-10-CM | POA: Diagnosis not present

## 2021-08-14 DIAGNOSIS — H401111 Primary open-angle glaucoma, right eye, mild stage: Secondary | ICD-10-CM | POA: Diagnosis not present

## 2021-09-13 DIAGNOSIS — Z23 Encounter for immunization: Secondary | ICD-10-CM | POA: Diagnosis not present

## 2021-09-24 ENCOUNTER — Other Ambulatory Visit: Payer: Self-pay | Admitting: Interventional Cardiology

## 2021-11-30 DIAGNOSIS — R42 Dizziness and giddiness: Secondary | ICD-10-CM | POA: Diagnosis not present

## 2021-12-02 ENCOUNTER — Other Ambulatory Visit: Payer: Self-pay | Admitting: Interventional Cardiology

## 2021-12-31 ENCOUNTER — Other Ambulatory Visit: Payer: Self-pay | Admitting: Interventional Cardiology

## 2021-12-31 DIAGNOSIS — R002 Palpitations: Secondary | ICD-10-CM

## 2021-12-31 MED ORDER — METOPROLOL SUCCINATE ER 25 MG PO TB24: 25.0000 mg | ORAL_TABLET | Freq: Every day | ORAL | 0 refills | Status: DC

## 2021-12-31 NOTE — Telephone Encounter (Signed)
It was refilled by cardiologist today for 15 tablets only. Please message from the patient about refills. Are you ok with taking over?

## 2022-02-19 DIAGNOSIS — Z961 Presence of intraocular lens: Secondary | ICD-10-CM | POA: Diagnosis not present

## 2022-03-06 DIAGNOSIS — D225 Melanocytic nevi of trunk: Secondary | ICD-10-CM | POA: Diagnosis not present

## 2022-03-06 DIAGNOSIS — Z1283 Encounter for screening for malignant neoplasm of skin: Secondary | ICD-10-CM | POA: Diagnosis not present

## 2022-03-12 ENCOUNTER — Encounter: Payer: Self-pay | Admitting: Primary Care

## 2022-03-12 ENCOUNTER — Ambulatory Visit (INDEPENDENT_AMBULATORY_CARE_PROVIDER_SITE_OTHER): Payer: Medicare Other | Admitting: Primary Care

## 2022-03-12 VITALS — BP 122/62 | HR 72 | Temp 97.6°F | Ht 62.0 in | Wt 125.0 lb

## 2022-03-12 DIAGNOSIS — H409 Unspecified glaucoma: Secondary | ICD-10-CM

## 2022-03-12 DIAGNOSIS — Z1231 Encounter for screening mammogram for malignant neoplasm of breast: Secondary | ICD-10-CM | POA: Diagnosis not present

## 2022-03-12 DIAGNOSIS — E559 Vitamin D deficiency, unspecified: Secondary | ICD-10-CM | POA: Diagnosis not present

## 2022-03-12 DIAGNOSIS — E785 Hyperlipidemia, unspecified: Secondary | ICD-10-CM

## 2022-03-12 DIAGNOSIS — R002 Palpitations: Secondary | ICD-10-CM | POA: Diagnosis not present

## 2022-03-12 DIAGNOSIS — Z1159 Encounter for screening for other viral diseases: Secondary | ICD-10-CM

## 2022-03-12 DIAGNOSIS — M159 Polyosteoarthritis, unspecified: Secondary | ICD-10-CM | POA: Diagnosis not present

## 2022-03-12 LAB — COMPREHENSIVE METABOLIC PANEL
ALT: 16 U/L (ref 0–35)
AST: 21 U/L (ref 0–37)
Albumin: 4.5 g/dL (ref 3.5–5.2)
Alkaline Phosphatase: 63 U/L (ref 39–117)
BUN: 21 mg/dL (ref 6–23)
CO2: 28 mEq/L (ref 19–32)
Calcium: 9.7 mg/dL (ref 8.4–10.5)
Chloride: 102 mEq/L (ref 96–112)
Creatinine, Ser: 0.63 mg/dL (ref 0.40–1.20)
GFR: 84.53 mL/min (ref >60.00)
Glucose, Bld: 89 mg/dL (ref 70–99)
Potassium: 4 mEq/L (ref 3.5–5.1)
Sodium: 138 mEq/L (ref 135–145)
Total Bilirubin: 1.7 mg/dL — ABNORMAL HIGH (ref 0.2–1.2)
Total Protein: 7 g/dL (ref 6.0–8.3)

## 2022-03-12 LAB — LIPID PANEL
Cholesterol: 147 mg/dL (ref 0–200)
HDL: 76 mg/dL (ref >39.00)
LDL Cholesterol: 51 mg/dL (ref 0–99)
NonHDL: 70.51
Total CHOL/HDL Ratio: 2
Triglycerides: 96 mg/dL (ref 0.0–149.0)
VLDL: 19.2 mg/dL (ref 0.0–40.0)

## 2022-03-12 LAB — CBC
HCT: 40.9 % (ref 36.0–46.0)
Hemoglobin: 13.4 g/dL (ref 12.0–15.0)
MCHC: 32.8 g/dL (ref 30.0–36.0)
MCV: 92 fl (ref 78.0–100.0)
Platelets: 203 10*3/uL (ref 150.0–400.0)
RBC: 4.45 Mil/uL (ref 3.87–5.11)
RDW: 12.8 % (ref 11.5–15.5)
WBC: 6 10*3/uL (ref 4.0–10.5)

## 2022-03-12 LAB — VITAMIN D 25 HYDROXY (VIT D DEFICIENCY, FRACTURES): VITD: 45.24 ng/mL (ref 30.00–100.00)

## 2022-03-12 NOTE — Patient Instructions (Addendum)
Stop by the lab prior to leaving today. I will notify you of your results once received.  ? ?Schedule the Medicare Phone call with our Medicare Wellness Nurse. ? ?It was a pleasure to see you today! ? ? ?

## 2022-03-12 NOTE — Assessment & Plan Note (Signed)
Following with ophthalmology. ?Continue Xalatan 0.005% drops. ?

## 2022-03-12 NOTE — Assessment & Plan Note (Signed)
Controlled.  Continue metoprolol succinate 25 mg daily. 

## 2022-03-12 NOTE — Progress Notes (Signed)
? ?Subjective:  ? ? Patient ID: Ann Estes, female    DOB: 1941-03-31, 81 y.o.   MRN: [id number] ? ?HPI ? ?[name] is a very pleasant 81 y.o. female with a history of osteoarthritis, glaucoma, hyperlipidemia, palpitations who presents today for follow-up of chronic conditions. ? ?1) Palpitations: Currently managed on metoprolol succinate 25 mg daily. She denies palpitations, chest pain, dizziness.   ? ?She has noticed increased effort with breathing during her routine walks. She walks about 1-2 miles daily, weather permitting. She denies chest pain, abdominal pain. She underwent echocardiogram and stress test at the time which were negative. ? ?2) Hyperlipidemia: Currently managed on rosuvastatin daily. ? ?3) Vitamin D Deficiency: Currently managed on 1000 IU daily.  ? ?4) Osteoarthritis: Currently managed on Meloxicam 7.5 mg BID. She walks daily. She is taking Meloxicam once daily. Overall feels well managed on this regimen.  ? ?Immunizations: ?-Tetanus: 2021 ?-Influenza: Completed last season ?-Covid-19: 4 vaccines ?-Shingles: Completed Zostavax and Shingrix series ?-Pneumonia: Prevnar 13 in 2014, Pneumovax 23 in 2016 ? ?Mammogram: Completed in 2021 ?Colonoscopy: N/A given age ?Dexa: Completed in 2022 ? ?BP Readings from Last 3 Encounters:  ?03/12/22 122/62  ?03/08/21 124/62  ?06/23/20 (!) 112/62  ? ? ? ? ? ?Review of Systems  ?Respiratory:    ?     See HPI  ?Cardiovascular:  Negative for chest pain.  ?Musculoskeletal:  Positive for arthralgias.  ?Neurological:  Negative for dizziness and headaches.  ? ?   ? ? ?Past Medical History:  ?Diagnosis Date  ? Acute cholecystitis due to biliary calculus 03/10/2020  ? BPPV (benign paroxysmal positional vertigo)   ? Chicken pox   ? Glaucoma   ? Hyperlipidemia   ? Osteoarthritis   ? Palpitations 10/13/2018  ? ? ?Social History  ? ?Socioeconomic History  ? Marital status: Divorced  ?  Spouse name: Not on file  ? Number of children: Not on file  ? Years of education: Not on  file  ? Highest education level: Not on file  ?Occupational History  ? Not on file  ?Tobacco Use  ? Smoking status: Former  ? Smokeless tobacco: Never  ?Vaping Use  ? Vaping Use: Never used  ?Substance and Sexual Activity  ? Alcohol use: Yes  ?  Comment: twice a year  ? Drug use: No  ? Sexual activity: Not on file  ?Other Topics Concern  ? Not on file  ?Social History Narrative  ? Not on file  ? ?Social Determinants of Health  ? ?Financial Resource Strain: Not on file  ?Food Insecurity: Not on file  ?Transportation Needs: Not on file  ?Physical Activity: Not on file  ?Stress: Not on file  ?Social Connections: Not on file  ?Intimate Partner Violence: Not on file  ? ? ?Past Surgical History:  ?Procedure Laterality Date  ? AUGMENTATION MAMMAPLASTY Bilateral ?  ? silicone implants now removed   ? BREAST IMPLANT REMOVAL    ? CATARACT EXTRACTION Bilateral   ? CHOLECYSTECTOMY N/A 03/11/2020  ? Procedure: LAPAROSCOPIC CHOLECYSTECTOMY;  Surgeon: Clovis Riley, MD;  Location: [facility];  Service: General;  Laterality: N/A;  ? COSMETIC SURGERY    ? Eyelids, tummy tuck  ? PLACEMENT OF BREAST IMPLANTS    ? TOTAL HIP ARTHROPLASTY Right 07/2004  ? TOTAL HIP ARTHROPLASTY Left 02/2005  ? TRIGGER FINGER RELEASE  11/2016  ? Thumb  ? ? ?Family History  ?Problem Relation Age of Onset  ? Heart disease Mother   ?  Cancer Father   ? Birth defects Daughter   ? Arthritis Son   ? Cancer Maternal Grandmother   ? Heart attack Maternal Grandfather   ? Asthma Sister   ? Depression Sister   ? Asthma Sister   ? ? ?No Known Allergies ? ?Current Outpatient Medications on File Prior to Visit  ?Medication Sig Dispense Refill  ? Green Tea, Camillia sinensis, (GREEN TEA PO) Take by mouth.     ? latanoprost (XALATAN) 0.005 % ophthalmic solution Place 1 drop into both eyes at bedtime.   2  ? meloxicam (MOBIC) 7.5 MG tablet TAKE 1 TABLET BY MOUTH TWICE A DAY AS NEEDED FOR PAIN 180 tablet 0  ? metoprolol succinate (TOPROL-XL) 25 MG 24 hr tablet Take 1 tablet  (25 mg total) by mouth daily. Office visit required for further refills. 90 tablet 0  ? rosuvastatin (CRESTOR) 5 MG tablet TAKE 1 TABLET (5 MG TOTAL) BY MOUTH EVERY EVENING. FOR CHOLESTEROL. 90 tablet 2  ? Multiple Vitamins-Minerals (ONE DAILY WOMENS 50 PLUS) TABS     ? Vitamin D, Cholecalciferol, 10 MCG (400 UNIT) TABS     ? ?No current facility-administered medications on file prior to visit.  ? ? ?BP 122/62   Pulse 72   Temp 97.6 ?F (36.4 ?C) (Oral)   Ht '5\' 2"'$  (1.575 m)   Wt 125 lb (56.7 kg)   SpO2 96%   BMI 22.86 kg/m?  ?Objective:  ? Physical Exam ?Cardiovascular:  ?   Rate and Rhythm: Normal rate and regular rhythm.  ?Pulmonary:  ?   Effort: Pulmonary effort is normal.  ?   Breath sounds: Normal breath sounds.  ?Abdominal:  ?   Palpations: Abdomen is soft.  ?   Tenderness: There is no abdominal tenderness.  ?Musculoskeletal:  ?   Cervical back: Neck supple.  ?Skin: ?   General: Skin is warm and dry.  ?Neurological:  ?   Mental Status: She is alert.  ?Psychiatric:     ?   Mood and Affect: Mood normal.  ? ? ? ? ? ?   ?Assessment & Plan:  ? ? ? ? ?This visit occurred during the SARS-CoV-2 public health emergency.  Safety protocols were in place, including screening questions prior to the visit, additional usage of staff PPE, and extensive cleaning of exam room while observing appropriate contact time as indicated for disinfecting solutions.  ?

## 2022-03-12 NOTE — Assessment & Plan Note (Signed)
Continue vitamin D 1000 IU daily. Repeat vitamin D level pending 

## 2022-03-12 NOTE — Assessment & Plan Note (Signed)
Continue rosuvastatin 5 mg daily. Repeat lipid panel pending 

## 2022-03-12 NOTE — Assessment & Plan Note (Signed)
Controlled. ? ?Continue Meloxicam 7.5 mg daily. ?Renal function pending. ?

## 2022-03-13 LAB — HEPATITIS C ANTIBODY
Hepatitis C Ab: NONREACTIVE
SIGNAL TO CUT-OFF: 0.16 (ref <1.00)

## 2022-03-15 ENCOUNTER — Other Ambulatory Visit: Payer: Self-pay | Admitting: Primary Care

## 2022-03-15 DIAGNOSIS — E785 Hyperlipidemia, unspecified: Secondary | ICD-10-CM

## 2022-03-28 ENCOUNTER — Ambulatory Visit (INDEPENDENT_AMBULATORY_CARE_PROVIDER_SITE_OTHER): Payer: Medicare Other

## 2022-03-28 ENCOUNTER — Ambulatory Visit: Payer: Medicare Other

## 2022-03-28 VITALS — Wt 125.0 lb

## 2022-03-28 DIAGNOSIS — Z0000 Encounter for general adult medical examination without abnormal findings: Secondary | ICD-10-CM

## 2022-03-28 NOTE — Progress Notes (Signed)
? ?Subjective:  ? Ann Estes is a 81 y.o. female who presents for Medicare Annual (Subsequent) preventive examination. ? ?Virtual Visit via Telephone Note ? ?I connected with  Ann Estes on 03/28/22 at  3:00 PM EDT by telephone and verified that I am speaking with the correct person using two identifiers. ? ?Location: ?Patient: Home ?Provider: [facility] ?Persons participating in the virtual visit: patient/Nurse Health Advisor ?  ?I discussed the limitations, risks, security and privacy concerns of performing an evaluation and management service by telephone and the availability of in person appointments. The patient expressed understanding and agreed to proceed. ? ?Interactive audio and video telecommunications were attempted between this nurse and patient, however failed, due to patient having technical difficulties OR patient did not have access to video capability.  We continued and completed visit with audio only. ? ?Some vital signs may be absent or patient reported.  ? ?Larkin Morelos Dionne Ano, LPN  ? ?Review of Systems    ? ?Cardiac Risk Factors include: advanced age (>80mn, >>66women);dyslipidemia ? ?   ?Objective:  ?  ?Today's Vitals  ? 03/28/22 1517  ?Weight: 125 lb (56.7 kg)  ? ?Body mass index is 22.86 kg/m?. ? ? ?  03/28/2022  ?  3:55 PM 03/02/2021  ?  1:21 PM 02/29/2020  ?  2:53 PM 02/04/2018  ? 12:14 PM  ?Advanced Directives  ?Does Patient Have a Medical Advance Directive? Yes Yes Yes Yes  ?Type of AParamedicof [place] will [place]Living will [place] will Healthcare Power of Attorney  ?Copy of HLathropin Chart? No - copy requested No - copy requested No - copy requested No - copy requested  ? ? ?Current Medications (verified) ?Outpatient Encounter Medications as of 03/28/2022  ?Medication Sig  ? cholecalciferol (VITAMIN D3) 25 MCG (1000 UNIT) tablet Take 1,000 Units by mouth daily.  ? Green Tea,  Camillia sinensis, (GREEN TEA PO) Take by mouth.   ? latanoprost (XALATAN) 0.005 % ophthalmic solution Place 1 drop into both eyes at bedtime.   ? meloxicam (MOBIC) 7.5 MG tablet TAKE 1 TABLET BY MOUTH TWICE A DAY AS NEEDED FOR PAIN  ? metoprolol succinate (TOPROL-XL) 25 MG 24 hr tablet Take 1 tablet (25 mg total) by mouth daily. Office visit required for further refills.  ? Multiple Vitamins-Minerals (ONE DAILY WOMENS 50 PLUS) TABS   ? rosuvastatin (CRESTOR) 5 MG tablet TAKE 1 TABLET (5 MG TOTAL) BY MOUTH EVERY EVENING. FOR CHOLESTEROL.  ? ?No facility-administered encounter medications on file as of 03/28/2022.  ? ? ?Allergies (verified) ?Patient has no known allergies.  ? ?History: ?Past Medical History:  ?Diagnosis Date  ? Acute cholecystitis due to biliary calculus 03/10/2020  ? BPPV (benign paroxysmal positional vertigo)   ? Chicken pox   ? Glaucoma   ? Hyperlipidemia   ? Osteoarthritis   ? Palpitations 10/13/2018  ? ?Past Surgical History:  ?Procedure Laterality Date  ? AUGMENTATION MAMMAPLASTY Bilateral ?  ? silicone implants now removed   ? BREAST IMPLANT REMOVAL    ? CATARACT EXTRACTION Bilateral   ? CHOLECYSTECTOMY N/A 03/11/2020  ? Procedure: LAPAROSCOPIC CHOLECYSTECTOMY;  Surgeon: CClovis Riley MD;  [facility]  Service: General;  Laterality: N/A;  ? COSMETIC SURGERY    ? Eyelids, tummy tuck  ? JOINT REPLACEMENT  both hips  ? you have this info  ? PLACEMENT OF BREAST IMPLANTS    ? TOTAL HIP ARTHROPLASTY  Right 07/2004  ? TOTAL HIP ARTHROPLASTY Left 02/2005  ? TRIGGER FINGER RELEASE  11/2016  ? Thumb  ? ?Family History  ?Problem Relation Age of Onset  ? Heart disease Mother   ? Cancer Father   ? Birth defects Daughter   ? Arthritis Son   ? Cancer Maternal Grandmother   ? Heart attack Maternal Grandfather   ? Asthma Sister   ? Depression Sister   ? Asthma Sister   ? ?Social History  ? ?Socioeconomic History  ? Marital status: Divorced  ?  Spouse name: Not on file  ? Number of children: Not on file   ? Years of education: Not on file  ? Highest education level: Not on file  ?Occupational History  ? Occupation: retired  ?Tobacco Use  ? Smoking status: Former  ?  Packs/day: 1.50  ?  Years: 20.00  ?  Pack years: 30.00  ?  Types: Cigarettes  ?  Quit date: 02/09/1999  ?  Years since quitting: 23.1  ? Smokeless tobacco: Never  ?Vaping Use  ? Vaping Use: Never used  ?Substance and Sexual Activity  ? Alcohol use: Yes  ?  Comment: twice a year  ? Drug use: No  ? Sexual activity: Not Currently  ?Other Topics Concern  ? Not on file  ?Social History Narrative  ? Not on file  ? ?Social Determinants of Health  ? ?Financial Resource Strain: Low Risk   ? Difficulty of Paying Living Expenses: Not hard at all  ?Food Insecurity: No Food Insecurity  ? Worried About Charity fundraiser in the Last Year: Never true  ? Ran Out of Food in the Last Year: Never true  ?Transportation Needs: No Transportation Needs  ? Lack of Transportation (Medical): No  ? Lack of Transportation (Non-Medical): No  ?Physical Activity: Sufficiently Active  ? Days of Exercise per Week: 7 days  ? Minutes of Exercise per Session: 30 min  ?Stress: No Stress Concern Present  ? Feeling of Stress : Not at all  ?Social Connections: Moderately Isolated  ? Frequency of Communication with Friends and Family: More than three times a week  ? Frequency of Social Gatherings with Friends and Family: More than three times a week  ? Attends Religious Services: Never  ? Active Member of Clubs or Organizations: Yes  ? Attends Archivist Meetings: More than 4 times per year  ? Marital Status: Divorced  ? ? ?Tobacco Counseling ?Counseling given: Not Answered ? ? ?Clinical Intake: ? ?Pre-visit preparation completed: Yes ? ?Pain : No/denies pain ? ?  ? ?BMI - recorded: 22.86 ?Nutritional Status: BMI of 19-24  Normal ?Nutritional Risks: None ?Diabetes: No ? ?How often do you need to have someone help you when you read instructions, pamphlets, or other written materials  from your doctor or pharmacy?: 1 - Never ? ?Diabetic?no ? ?Interpreter Needed?: No ? ?Information entered by :: Ann Degracia, LPN ? ? ?Activities of Daily Living ? ?  03/28/2022  ?  3:28 PM 03/12/2022  ? 10:28 AM  ?In your present state of health, do you have any difficulty performing the following activities:  ?Hearing? 0 1  ?Vision? 0 1  ?Difficulty concentrating or making decisions? 0 1  ?Walking or climbing stairs? 0 1  ?Dressing or bathing? 0 1  ?Doing errands, shopping? 0 1  ?Preparing Food and eating ? N   ?Using the Toilet? N   ?In the past six months, have you accidently leaked urine?  N   ?Do you have problems with loss of bowel control? N   ?Managing your Medications? N   ?Managing your Finances? N   ?Housekeeping or managing your Housekeeping? N   ? ? ?Patient Care Team: ?Pleas Koch, NP as PCP - General (Internal Medicine) ?Belva Crome, MD as PCP - Cardiology (Cardiology) ?Leandrew Koyanagi, MD as Referring Physician (Ophthalmology) ? ?Indicate any recent Medical Services you may have received from other than [facility] providers in the past year (date may be approximate). ? ?   ?Assessment:  ? This is a routine wellness examination for Xoe. ? ?Hearing/Vision screen ?Hearing Screening - Comments:: Denies hearing difficulties   ?Vision Screening - Comments:: Wears rx glasses - up to date with routine eye exams with [facility] ? ?Dietary issues and exercise activities discussed: ?Current Exercise Habits: Home exercise routine, Type of exercise: walking, Time (Minutes): 30, Frequency (Times/Week): 7, Weekly Exercise (Minutes/Week): 210, Intensity: Mild, Exercise limited by: orthopedic condition(s) ? ? Goals Addressed   ? ?  ?  ?  ?  ? This Visit's Progress  ?  Patient Stated     ?  03/28/2022 - Maintain independence ?  ? ?  ? ?Depression Screen ? ?  03/28/2022  ?  3:56 PM 03/12/2022  ? 10:28 AM 03/02/2021  ?  1:22 PM 02/29/2020  ?  2:55 PM 02/15/2019  ? 12:03 PM 02/04/2018  ? 11:59 AM  ?PHQ 2/9 Scores  ?PHQ - 2  Score 1 1 0 0 0 0  ?PHQ- 9 Score 3 3 0 0  0  ?  ?Fall Risk ? ?  03/12/2022  ? 10:29 AM 03/02/2021  ?  1:22 PM 02/29/2020  ?  2:54 PM 02/15/2019  ? 12:03 PM 02/04/2018  ? 11:59 AM  ?Fall Risk   ?Falls in the past yea

## 2022-03-28 NOTE — Patient Instructions (Signed)
Ann Estes , ?Thank you for taking time to come for your Medicare Wellness Visit. I appreciate your ongoing commitment to your health goals. Please review the following plan we discussed and let me know if I can assist you in the future.  ? ?Screening recommendations/referrals: ?Colonoscopy: No longer required  ?Mammogram: Done 12/22/2019 - Repeat every 2 years  ?Bone Density: Done 07/18/2021 - Repeat every 2 years ?Recommended yearly ophthalmology/optometry visit for glaucoma screening and checkup ?Recommended yearly dental visit for hygiene and checkup ? ?Vaccinations: ?Influenza vaccine: Done 09/13/2021 - Repeat annually ?Pneumococcal vaccine: Done 11/13/2013 & 12/22/2014  ?Tdap vaccine: Done 03/07/2020 - Repeat in 10 years ?Shingles vaccine: Done 05/19/2018 & 09/26/2018 ?Covid-19: Done 12/04/2019, 12/25/2019, 08/25/2020, 02/21/2021, & 08/08/2021 ? ?Advanced directives: Please bring a copy of your health care power of attorney and living will to the office to be added to your chart at your convenience.  ? ?Conditions/risks identified: Aim for 30 minutes of exercise or brisk walking, 6-8 glasses of water, and 5 servings of fruits and vegetables each day.  ? ?Next appointment: Follow up in one year for your annual wellness visit  ? ? ?Preventive Care 75 Years and Older, Female ?Preventive care refers to lifestyle choices and visits with your health care provider that can promote health and wellness. ?What does preventive care include? ?A yearly physical exam. This is also called an annual well check. ?Dental exams once or twice a year. ?Routine eye exams. Ask your health care provider how often you should have your eyes checked. ?Personal lifestyle choices, including: ?Daily care of your teeth and gums. ?Regular physical activity. ?Eating a healthy diet. ?Avoiding tobacco and drug use. ?Limiting alcohol use. ?Practicing safe sex. ?Taking low-dose aspirin every day. ?Taking vitamin and mineral supplements as recommended by your  health care provider. ?What happens during an annual well check? ?The services and screenings done by your health care provider during your annual well check will depend on your age, overall health, lifestyle risk factors, and family history of disease. ?Counseling  ?Your health care provider may ask you questions about your: ?Alcohol use. ?Tobacco use. ?Drug use. ?Emotional well-being. ?Home and relationship well-being. ?Sexual activity. ?Eating habits. ?History of falls. ?Memory and ability to understand (cognition). ?Work and work Statistician. ?Reproductive health. ?Screening  ?You may have the following tests or measurements: ?Height, weight, and BMI. ?Blood pressure. ?Lipid and cholesterol levels. These may be checked every 5 years, or more frequently if you are over 67 years old. ?Skin check. ?Lung cancer screening. You may have this screening every year starting at age 68 if you have a 30-pack-year history of smoking and currently smoke or have quit within the past 15 years. ?Fecal occult blood test (FOBT) of the stool. You may have this test every year starting at age 74. ?Flexible sigmoidoscopy or colonoscopy. You may have a sigmoidoscopy every 5 years or a colonoscopy every 10 years starting at age 61. ?Hepatitis C blood test. ?Hepatitis B blood test. ?Sexually transmitted disease (STD) testing. ?Diabetes screening. This is done by checking your blood sugar (glucose) after you have not eaten for a while (fasting). You may have this done every 1-3 years. ?Bone density scan. This is done to screen for osteoporosis. You may have this done starting at age 37. ?Mammogram. This may be done every 1-2 years. Talk to your health care provider about how often you should have regular mammograms. ?Talk with your health care provider about your test results, treatment options, and if  necessary, the need for more tests. ?Vaccines  ?Your health care provider may recommend certain vaccines, such as: ?Influenza vaccine.  This is recommended every year. ?Tetanus, diphtheria, and acellular pertussis (Tdap, Td) vaccine. You may need a Td booster every 10 years. ?Zoster vaccine. You may need this after age 40. ?Pneumococcal 13-valent conjugate (PCV13) vaccine. One dose is recommended after age 1. ?Pneumococcal polysaccharide (PPSV23) vaccine. One dose is recommended after age 37. ?Talk to your health care provider about which screenings and vaccines you need and how often you need them. ?This information is not intended to replace advice given to you by your health care provider. Make sure you discuss any questions you have with your health care provider. ?Document Released: 12/08/2015 Document Revised: 07/31/2016 Document Reviewed: 09/12/2015 ?Elsevier Interactive Patient Education ? 2017 [place]. ? ?Fall Prevention in the Home ?Falls can cause injuries. They can happen to people of all ages. There are many things you can do to make your home safe and to help prevent falls. ?What can I do on the outside of my home? ?Regularly fix the edges of walkways and driveways and fix any cracks. ?Remove anything that might make you trip as you walk through a door, such as a raised step or threshold. ?Trim any bushes or trees on the path to your home. ?Use bright outdoor lighting. ?Clear any walking paths of anything that might make someone trip, such as rocks or tools. ?Regularly check to see if handrails are loose or broken. Make sure that both sides of any steps have handrails. ?Any raised decks and porches should have guardrails on the edges. ?Have any leaves, snow, or ice cleared regularly. ?Use sand or salt on walking paths during winter. ?Clean up any spills in your garage right away. This includes oil or grease spills. ?What can I do in the bathroom? ?Use night lights. ?Install grab bars by the toilet and in the tub and shower. Do not use towel bars as grab bars. ?Use non-skid mats or decals in the tub or shower. ?If you need to sit  down in the shower, use a plastic, non-slip stool. ?Keep the floor dry. Clean up any water that spills on the floor as soon as it happens. ?Remove soap buildup in the tub or shower regularly. ?Attach bath mats securely with double-sided non-slip rug tape. ?Do not have throw rugs and other things on the floor that can make you trip. ?What can I do in the bedroom? ?Use night lights. ?Make sure that you have a light by your bed that is easy to reach. ?Do not use any sheets or blankets that are too big for your bed. They should not hang down onto the floor. ?Have a firm chair that has side arms. You can use this for support while you get dressed. ?Do not have throw rugs and other things on the floor that can make you trip. ?What can I do in the kitchen? ?Clean up any spills right away. ?Avoid walking on wet floors. ?Keep items that you use a lot in easy-to-reach places. ?If you need to reach something above you, use a strong step stool that has a grab bar. ?Keep electrical cords out of the way. ?Do not use floor polish or wax that makes floors slippery. If you must use wax, use non-skid floor wax. ?Do not have throw rugs and other things on the floor that can make you trip. ?What can I do with my stairs? ?Do not leave any items  on the stairs. ?Make sure that there are handrails on both sides of the stairs and use them. Fix handrails that are broken or loose. Make sure that handrails are as long as the stairways. ?Check any carpeting to make sure that it is firmly attached to the stairs. Fix any carpet that is loose or worn. ?Avoid having throw rugs at the top or bottom of the stairs. If you do have throw rugs, attach them to the floor with carpet tape. ?Make sure that you have a light switch at the top of the stairs and the bottom of the stairs. If you do not have them, ask someone to add them for you. ?What else can I do to help prevent falls? ?Wear shoes that: ?Do not have high heels. ?Have rubber bottoms. ?Are  comfortable and fit you well. ?Are closed at the toe. Do not wear sandals. ?If you use a stepladder: ?Make sure that it is fully opened. Do not climb a closed stepladder. ?Make sure that both sides of the ste

## 2022-04-04 ENCOUNTER — Ambulatory Visit: Payer: Medicare Other

## 2022-04-12 ENCOUNTER — Other Ambulatory Visit: Payer: Self-pay | Admitting: Primary Care

## 2022-04-12 DIAGNOSIS — R002 Palpitations: Secondary | ICD-10-CM

## 2022-05-06 DIAGNOSIS — Z96643 Presence of artificial hip joint, bilateral: Secondary | ICD-10-CM | POA: Diagnosis not present

## 2022-05-06 DIAGNOSIS — T8489XA Other specified complication of internal orthopedic prosthetic devices, implants and grafts, initial encounter: Secondary | ICD-10-CM | POA: Diagnosis not present

## 2022-05-06 DIAGNOSIS — M25352 Other instability, left hip: Secondary | ICD-10-CM | POA: Diagnosis not present

## 2022-05-08 DIAGNOSIS — Z96643 Presence of artificial hip joint, bilateral: Secondary | ICD-10-CM | POA: Diagnosis not present

## 2022-05-24 ENCOUNTER — Ambulatory Visit (INDEPENDENT_AMBULATORY_CARE_PROVIDER_SITE_OTHER)
Admission: RE | Admit: 2022-05-24 | Discharge: 2022-05-24 | Disposition: A | Discharge status: Home / Self Care | Payer: Medicare Other | Source: Ambulatory Visit | Attending: Primary Care | Admitting: Primary Care

## 2022-05-24 ENCOUNTER — Ambulatory Visit (INDEPENDENT_AMBULATORY_CARE_PROVIDER_SITE_OTHER): Payer: Medicare Other | Admitting: Primary Care

## 2022-05-24 VITALS — BP 118/64 | HR 76 | Temp 98.5°F | Ht 62.0 in | Wt 125.0 lb

## 2022-05-24 DIAGNOSIS — R0609 Other forms of dyspnea: Secondary | ICD-10-CM | POA: Insufficient documentation

## 2022-05-24 DIAGNOSIS — R0600 Dyspnea, unspecified: Secondary | ICD-10-CM | POA: Diagnosis not present

## 2022-05-24 LAB — BASIC METABOLIC PANEL
BUN: 18 mg/dL (ref 6–23)
CO2: 33 mEq/L — ABNORMAL HIGH (ref 19–32)
Calcium: 10.1 mg/dL (ref 8.4–10.5)
Chloride: 103 mEq/L (ref 96–112)
Creatinine, Ser: 0.7 mg/dL (ref 0.40–1.20)
GFR: 82.29 mL/min (ref >60.00)
Glucose, Bld: 109 mg/dL — ABNORMAL HIGH (ref 70–99)
Potassium: 4.8 mEq/L (ref 3.5–5.1)
Sodium: 142 mEq/L (ref 135–145)

## 2022-05-24 LAB — CBC
HCT: 41.3 % (ref 36.0–46.0)
Hemoglobin: 13.5 g/dL (ref 12.0–15.0)
MCHC: 32.6 g/dL (ref 30.0–36.0)
MCV: 92.9 fl (ref 78.0–100.0)
Platelets: 197 10*3/uL (ref 150.0–400.0)
RBC: 4.45 Mil/uL (ref 3.87–5.11)
RDW: 13.3 % (ref 11.5–15.5)
WBC: 5.9 10*3/uL (ref 4.0–10.5)

## 2022-05-24 LAB — BRAIN NATRIURETIC PEPTIDE: Pro B Natriuretic peptide (BNP): 108 pg/mL — ABNORMAL HIGH (ref 0.0–100.0)

## 2022-05-24 NOTE — Assessment & Plan Note (Addendum)
Exam today stable.  Differentials include CAD, COPD, heart failure, paroxysmal atrial fibrillation, symptomatic PVC's.   ECG today with NSR, rate of 69, no PAC/pVC, no acute ST changes. Appears similar to ECG from 2021.  Checking labs and chest xray today. Recommended she update her cardiologist.  Consider PFT's.  Await results.

## 2022-05-24 NOTE — Progress Notes (Signed)
Subjective:    Patient ID: Ann Estes, female    DOB: 1941/03/21, 81 y.o.   MRN: [id number]  Shortness of Breath Pertinent negatives include no chest pain, headaches or leg swelling.  Hip Pain     Ann Estes is a very pleasant 81 y.o. female with a history of osteoarthritis, glaucoma, hyperlipidemia, PVC's, EKG abnormality who presents today to discuss exertional dyspnea.  Shortness of breath began around March 2023, occurs only with exertion only. She will begin to experience dyspnea when walking up an incline or when walking more than 1/2 mile. She denies exertional chest pain. She denies dyspnea with mild exertion such as ADL's or at the grocery store, when walking upstairs in her home. She does notice feeling "tired in the afternoon a lot".   Follows with cardiology, she has an appointment scheduled in September 2023. She is a prior smoker, smoked for about 30 years, quit around early 2000's. Isn't sure.   She underwent chest xray in 2021 which did not show cardiomegaly or lung disease. She underwent stress test in 2020 which was negative for blockages. She underwent echocardiogram in 2020 which revealed LVEF of 55-60%, elevated left atrial and left ventricular pressures, mild aortic valve thickening.    Review of Systems  Constitutional:  Positive for fatigue.  Respiratory:  Positive for shortness of breath.   Cardiovascular:  Negative for chest pain and leg swelling.  Neurological:  Negative for dizziness and headaches.         Past Medical History:  Diagnosis Date   Acute cholecystitis due to biliary calculus 03/10/2020   BPPV (benign paroxysmal positional vertigo)    Chicken pox    Glaucoma    Hyperlipidemia    Osteoarthritis    Palpitations 10/13/2018    Social History   Socioeconomic History   Marital status: Divorced    Spouse name: Not on file   Number of children: Not on file   Years of education: Not on file   Highest education level: Not on file   Occupational History   Occupation: retired  Tobacco Use   Smoking status: Former    Packs/day: 1.50    Years: 20.00    Total pack years: 30.00    Types: Cigarettes    Quit date: 02/09/1999    Years since quitting: 23.3   Smokeless tobacco: Never  Vaping Use   Vaping Use: Never used  Substance and Sexual Activity   Alcohol use: Yes    Comment: twice a year   Drug use: No   Sexual activity: Not Currently  Other Topics Concern   Not on file  Social History Narrative   Not on file   Social Determinants of Health   Financial Resource Strain: Low Risk  (03/28/2022)   Overall Financial Resource Strain (CARDIA)    Difficulty of Paying Living Expenses: Not hard at all  Food Insecurity: No Food Insecurity (03/28/2022)   Hunger Vital Sign    Worried About Running Out of Food in the Last Year: Never true    [place] in the Last Year: Never true  Transportation Needs: No Transportation Needs (03/28/2022)   PRAPARE - Hydrologist (Medical): No    Lack of Transportation (Non-Medical): No  Physical Activity: Sufficiently Active (03/28/2022)   Exercise Vital Sign    Days of Exercise per Week: 7 days    Minutes of Exercise per Session: 30 min  Stress: No Stress Concern Present (03/28/2022)  [employer] - Occupational Stress Questionnaire    Feeling of Stress : Not at all  Social Connections: Moderately Isolated (03/28/2022)   Social Connection and Isolation Panel [NHANES]    Frequency of Communication with Friends and Family: More than three times a week    Frequency of Social Gatherings with Friends and Family: More than three times a week    Attends Religious Services: Never    Marine scientist or Organizations: Yes    Attends Music therapist: More than 4 times per year    Marital Status: Divorced  Intimate Partner Violence: Not At Risk (03/28/2022)   Humiliation, Afraid, Rape, and Kick questionnaire    Fear  of Current or Ex-Partner: No    Emotionally Abused: No    Physically Abused: No    Sexually Abused: No    Past Surgical History:  Procedure Laterality Date   AUGMENTATION MAMMAPLASTY Bilateral ?   silicone implants now removed    BREAST IMPLANT REMOVAL     CATARACT EXTRACTION Bilateral    CHOLECYSTECTOMY N/A 03/11/2020   Procedure: LAPAROSCOPIC CHOLECYSTECTOMY;  Surgeon: Clovis Riley, MD;  Location: [facility];  Service: General;  Laterality: N/A;   COSMETIC SURGERY     Eyelids, tummy tuck   JOINT REPLACEMENT  both hips   you have this info   PLACEMENT OF BREAST IMPLANTS     TOTAL HIP ARTHROPLASTY Right 07/2004   TOTAL HIP ARTHROPLASTY Left 02/2005   TRIGGER FINGER RELEASE  11/2016   Thumb    Family History  Problem Relation Age of Onset   Heart disease Mother    Cancer Father    Birth defects Daughter    Arthritis Son    Cancer Maternal Grandmother    Heart attack Maternal Grandfather    Asthma Sister    Depression Sister    Asthma Sister     No Known Allergies  Current Outpatient Medications on File Prior to Visit  Medication Sig Dispense Refill   b complex vitamins capsule Take by mouth.     Cholecalciferol 50 MCG (2000 UT) CAPS Take by mouth.     cholecalciferol (VITAMIN D3) 25 MCG (1000 UNIT) tablet Take 1,000 Units by mouth daily.     Green Tea, Camillia sinensis, (GREEN TEA PO) Take by mouth.      latanoprost (XALATAN) 0.005 % ophthalmic solution Place 1 drop into both eyes at bedtime.   2   meloxicam (MOBIC) 7.5 MG tablet TAKE 1 TABLET BY MOUTH TWICE A DAY AS NEEDED FOR PAIN 180 tablet 0   metoprolol succinate (TOPROL-XL) 25 MG 24 hr tablet Take 1 tablet (25 mg total) by mouth daily. For palpitations 90 tablet 3   Multiple Vitamins-Minerals (ONE DAILY WOMENS 50 PLUS) TABS      rosuvastatin (CRESTOR) 5 MG tablet TAKE 1 TABLET (5 MG TOTAL) BY MOUTH EVERY EVENING. FOR CHOLESTEROL. 90 tablet 3   No current facility-administered medications on file prior to  visit.    BP 118/64   Pulse 76   Temp 98.5 F (36.9 C) (Oral)   Ht '5\' 2"'$  (1.575 m)   Wt 125 lb (56.7 kg)   SpO2 97%   BMI 22.86 kg/m  Objective:   Physical Exam Constitutional:      General: She is not in acute distress. Cardiovascular:     Rate and Rhythm: Normal rate and regular rhythm.  Pulmonary:     Effort: Pulmonary effort is normal.  Breath sounds: Normal breath sounds.  Musculoskeletal:     Cervical back: Neck supple.  Skin:    General: Skin is warm and dry.  Neurological:     Mental Status: She is alert.           Assessment & Plan:   Problem List Items Addressed This Visit       Other   Exertional dyspnea - Primary    Exam today stable.  Differentials include CAD, COPD, heart failure, paroxysmal atrial fibrillation, symptomatic PVC's.   ECG today with NSR, rate of 69, no PAC/pVC, no acute ST changes. Appears similar to ECG from 2021.  Checking labs and chest xray today. Recommended she update her cardiologist.  Consider PFT's.  Await results.         Relevant Orders   CBC   Basic metabolic panel   DG Chest 2 View   EKG 12-Lead (Completed)   Brain natriuretic peptide       Pleas Koch, NP

## 2022-05-24 NOTE — Patient Instructions (Signed)
Stop by the lab and xray prior to leaving today. I will notify you of your results once received.   Please notify your cardiologist of your symptoms.   It was a pleasure to see you today!

## 2022-05-25 DIAGNOSIS — Z96643 Presence of artificial hip joint, bilateral: Secondary | ICD-10-CM | POA: Diagnosis not present

## 2022-05-25 DIAGNOSIS — M7602 Gluteal tendinitis, left hip: Secondary | ICD-10-CM | POA: Diagnosis not present

## 2022-05-25 DIAGNOSIS — M7601 Gluteal tendinitis, right hip: Secondary | ICD-10-CM | POA: Diagnosis not present

## 2022-05-29 ENCOUNTER — Telehealth: Payer: Self-pay

## 2022-05-29 NOTE — Telephone Encounter (Signed)
Patient is returning a call to [facility], states she has seen her results for labs and xray. Just wanted to follow up more on the blood work and iron levels.

## 2022-05-30 NOTE — Telephone Encounter (Signed)
See results notes for documentation. No further action needed on this message.

## 2022-06-13 ENCOUNTER — Other Ambulatory Visit: Payer: Self-pay | Admitting: Primary Care

## 2022-06-13 DIAGNOSIS — M1990 Unspecified osteoarthritis, unspecified site: Secondary | ICD-10-CM

## 2022-06-13 DIAGNOSIS — M159 Polyosteoarthritis, unspecified: Secondary | ICD-10-CM

## 2022-06-14 ENCOUNTER — Encounter (INDEPENDENT_AMBULATORY_CARE_PROVIDER_SITE_OTHER): Payer: Medicare Other

## 2022-06-14 DIAGNOSIS — M810 Age-related osteoporosis without current pathological fracture: Secondary | ICD-10-CM

## 2022-06-20 MED ORDER — IBANDRONATE SODIUM 150 MG PO TABS: 150.0000 mg | ORAL_TABLET | ORAL | 3 refills | Status: DC

## 2022-06-20 NOTE — Telephone Encounter (Signed)
Please see the MyChart message reply(ies) for my assessment and plan.  The patient gave consent for this Medical Advice Message and is aware that it may result in a bill to their insurance company as well as the possibility that this may result in a co-payment or deductible. They are an established patient, but are not seeking medical advice exclusively about a problem treated during an in person or video visit in the last 7 days. I did not recommend an in person or video visit within 7 days of my reply.  I spent a total of 10 minutes cumulative time within 7 days through [place], NP

## 2022-06-27 ENCOUNTER — Ambulatory Visit: Payer: Medicare Other | Admitting: Interventional Cardiology

## 2022-07-16 ENCOUNTER — Ambulatory Visit
Admission: RE | Admit: 2022-07-16 | Discharge: 2022-07-16 | Disposition: A | Discharge status: Home / Self Care | Payer: Medicare Other | Source: Ambulatory Visit | Attending: Primary Care | Admitting: Primary Care

## 2022-07-16 DIAGNOSIS — Z1231 Encounter for screening mammogram for malignant neoplasm of breast: Secondary | ICD-10-CM | POA: Insufficient documentation

## 2022-07-29 NOTE — Progress Notes (Signed)
Cardiology Office Note:    Date:  07/30/2022   ID:  Ann Estes, DOB [birth year], MRN [id number]  PCP:  Pleas Koch, NP  Cardiologist:  Sinclair Grooms, MD   Referring MD: Pleas Koch, NP   Chief Complaint  Patient presents with   Shortness of Breath    Dyspnea on exertion    History of Present Illness:    Ann Estes is a 81 y.o. 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  Referred because of dyspnea on exertion.   She has been experiencing some dyspnea on exertion since April.  She generally walks about 30 minutes every day.  When she walks up a particularly incline now she gets somewhat breathless although she does not feel that there is a particular problem.  There is no associated shortness of breath.  She spoke to Alma Friendly, nurse practitioner and was referred because of this dyspnea.  She was seen 3 years ago because of palpitations and dyspnea on exertion and had a negative cardiac work-up including a stress nuclear, echo, and long-term monitor.  Past Medical History:  Diagnosis Date   Acute cholecystitis due to biliary calculus 03/10/2020   BPPV (benign paroxysmal positional vertigo)    Chicken pox    Glaucoma    Hyperlipidemia    Osteoarthritis    Palpitations 10/13/2018    Past Surgical History:  Procedure Laterality Date   AUGMENTATION MAMMAPLASTY Bilateral ?   silicone implants now removed    BREAST IMPLANT REMOVAL     CATARACT EXTRACTION Bilateral    CHOLECYSTECTOMY N/A 03/11/2020   Procedure: LAPAROSCOPIC CHOLECYSTECTOMY;  Surgeon: Clovis Riley, MD;  Location: [facility];  Service: General;  Laterality: N/A;   COSMETIC SURGERY     Eyelids, tummy tuck   JOINT REPLACEMENT  both hips   you have this info   PLACEMENT OF BREAST IMPLANTS     TOTAL HIP ARTHROPLASTY Right 07/2004   TOTAL HIP ARTHROPLASTY Left 02/2005   TRIGGER FINGER RELEASE  11/2016   Thumb     Current Medications: Current Meds  Medication Sig   Alpha-Lipoic Acid 600 MG TABS Take 1 capsule by mouth daily at 12 noon.   b complex vitamins capsule Take by mouth.   Calcium Carbonate (CALCIUM 600 PO) Take 2 tablets by mouth daily at 6 (six) AM.   cholecalciferol (VITAMIN D3) 25 MCG (1000 UNIT) tablet Take 1,000 Units by mouth daily.   Green Tea, Camillia sinensis, (GREEN TEA PO) Take by mouth.    ibandronate (BONIVA) 150 MG tablet Take 1 tablet (150 mg total) by mouth every 30 (thirty) days. Take in the morning with a full glass of water, on an empty stomach, and do not take anything else by mouth for 30 min. Avoid laying flat for 2 hours.   latanoprost (XALATAN) 0.005 % ophthalmic solution Place 1 drop into both eyes at bedtime.    meloxicam (MOBIC) 7.5 MG tablet TAKE 1 TABLET BY MOUTH TWICE A DAY AS NEEDED FOR PAIN   metoprolol succinate (TOPROL XL) 25 MG 24 hr tablet Take 1 tablet (25 mg total) by mouth every other day.   Multiple Vitamins-Minerals (ONE DAILY WOMENS 50 PLUS) TABS    rosuvastatin (CRESTOR) 5 MG tablet TAKE 1 TABLET (5 MG TOTAL) BY MOUTH EVERY EVENING. FOR CHOLESTEROL.   [DISCONTINUED] metoprolol succinate (TOPROL-XL) 25 MG 24 hr tablet Take 1 tablet (25 mg total) by mouth daily.  For palpitations     Allergies:   Patient has no known allergies.   Social History   Socioeconomic History   Marital status: Divorced    Spouse name: Not on file   Number of children: Not on file   Years of education: Not on file   Highest education level: Not on file  Occupational History   Occupation: retired  Tobacco Use   Smoking status: Former    Packs/day: 1.50    Years: 20.00    Total pack years: 30.00    Types: Cigarettes    Quit date: 02/09/1999    Years since quitting: 23.4   Smokeless tobacco: Never  Vaping Use   Vaping Use: Never used  Substance and Sexual Activity   Alcohol use: Yes    Comment: twice a year   Drug use: No   Sexual activity: Not Currently   Other Topics Concern   Not on file  Social History Narrative   Not on file   Social Determinants of Health   Financial Resource Strain: Low Risk  (03/28/2022)   Overall Financial Resource Strain (CARDIA)    Difficulty of Paying Living Expenses: Not hard at all  Food Insecurity: No Food Insecurity (03/28/2022)   Hunger Vital Sign    Worried About Running Out of Food in the Last Year: Never true    [place] in the Last Year: Never true  Transportation Needs: No Transportation Needs (03/28/2022)   PRAPARE - Hydrologist (Medical): No    Lack of Transportation (Non-Medical): No  Physical Activity: Sufficiently Active (03/28/2022)   Exercise Vital Sign    Days of Exercise per Week: 7 days    Minutes of Exercise per Session: 30 min  Stress: No Stress Concern Present (03/28/2022)   [place]    Feeling of Stress : Not at all  Social Connections: Moderately Isolated (03/28/2022)   Social Connection and Isolation Panel [NHANES]    Frequency of Communication with Friends and Family: More than three times a week    Frequency of Social Gatherings with Friends and Family: More than three times a week    Attends Religious Services: Never    Marine scientist or Organizations: Yes    Attends Music therapist: More than 4 times per year    Marital Status: Divorced     Family History: The patient's family history includes Arthritis in her son; Asthma in her sister and sister; Birth defects in her daughter; Cancer in her father and maternal grandmother; Depression in her sister; Heart attack in her maternal grandfather; Heart disease in her mother.  ROS:   Please see the history of present illness.    No palpitations since metoprolol has been started.  She has been reading about her meds and wants to see if decreasing the dose of metoprolol will help the shortness of breath that she is  experiencing.  All other systems reviewed and are negative.  EKGs/Labs/Other Studies Reviewed:    The following studies were reviewed today: NUCLEAR STRESS 2020: Study Highlights    Nuclear stress EF: 58%. Blood pressure demonstrated a normal response to exercise. There was no ST segment deviation noted during stress. No T wave inversion was noted during stress. Defect 1: There is a small defect of mild severity. This is a low risk study.   No significant reversible ischemia. LVEF 58% with normal wall motion. This  is a low risk study.    ECHOCARDIOGRAM 2020: IMPRESSIONS     1. The left ventricle has normal systolic function, with an ejection  fraction of 55-60%. The cavity size was normal. Left ventricular diastolic  Doppler parameters are consistent with impaired relaxation. Elevated left  atrial and left ventricular  end-diastolic pressures.   2. The right ventricle has normal systolic function. The cavity was  normal. There is no increase in right ventricular wall thickness.   3. Mild thickening of the aortic valve.   4. The aortic root and ascending aorta are normal in size and structure.   LONG TERM MONITOR 2020: Study Highlights  Underlying rhythm is NSR Frequent PVC's occuring isolated, in bigeminy, and occasional couplets, triplets, and one 4 beat run PVC;s are multiform SVT up to 16 beats No apparent cerrelation between symptoms and rhythm disturbance  EKG:  EKG not repeated.  EKG performed in was similar to 2021.  She has incomplete right bundle with leftward axis.  No changes noted.  Recent Labs: 03/12/2022: ALT 16 05/24/2022: BUN 18; Creatinine, Ser 0.70; Hemoglobin 13.5; Platelets 197.0; Potassium 4.8; Pro B Natriuretic peptide (BNP) 108.0; Sodium 142  Recent Lipid Panel    Component Value Date/Time   CHOL 147 03/12/2022 1051   TRIG 96.0 03/12/2022 1051   HDL 76.00 03/12/2022 1051   CHOLHDL 2 03/12/2022 1051   VLDL 19.2 03/12/2022 1051   LDLCALC 51  03/12/2022 1051    Physical Exam:    VS:  BP (!) 106/58   Pulse 67   Ht '5\' 2"'$  (1.575 m)   Wt 124 lb (56.2 kg)   SpO2 97%   BMI 22.68 kg/m     Wt Readings from Last 3 Encounters:  07/30/22 124 lb (56.2 kg)  05/24/22 125 lb (56.7 kg)  03/28/22 125 lb (56.7 kg)     GEN: Healthy appearing not overweight.. No acute distress HEENT: Normal NECK: No JVD. LYMPHATICS: No lymphadenopathy CARDIAC: No murmur. RRR no gallop, or edema. VASCULAR:  Normal Pulses. No bruits. RESPIRATORY:  Clear to auscultation without rales, wheezing or rhonchi  ABDOMEN: Soft, non-tender, non-distended, No pulsatile mass, MUSCULOSKELETAL: No deformity  SKIN: Warm and dry NEUROLOGIC:  Alert and oriented x 3 PSYCHIATRIC:  Normal affect   ASSESSMENT:    1. Dyspnea on exertion   2. Hyperlipidemia, unspecified hyperlipidemia type   3. ECG abnormality   4. PVC's (premature ventricular contractions)   5. PSVT (paroxysmal supraventricular tachycardia) (HCC)    PLAN:    In order of problems listed above:  She walks at least 30 minutes every day.  On occasion she has some dyspnea when walking an incline.  She feels this is relatively new over the last 6 months.  No associated chest discomfort.  Relatively negative work-up over the past 3 years.  EKG is unchanged.  Blood pressure is relatively low.  We will cut back on metoprolol succinate to 12.5 mg daily.  She wants to take 25 mg every other day which is fine. His recent LDL was 51.  Continue statin therapy. EKG done in June and is unchanged from priors. If she has increased PVCs of PSVT we may have to go back to 25 mg/day.  These have been suppressed by metoprolol.  Her sister has PAF and required watchman.   Clinical follow-up as needed/1 year.  She will let us know how she does on lower dose of metoprolol.   Medication Adjustments/Labs and Tests Ordered: Current medicines are reviewed at  length with the patient today.  Concerns regarding medicines are  outlined above.  No orders of the defined types were placed in this encounter.  Meds ordered this encounter  Medications   metoprolol succinate (TOPROL XL) 25 MG 24 hr tablet    Sig: Take 1 tablet (25 mg total) by mouth every other day.    Dispense:  45 tablet    Refill:  3    Patient Instructions  Medication Instructions:  Your physician has recommended you make the following change in your medication:   1) DECREASE metoprolol succinate (Toprol XL) '25mg'$  to every other day  *If you need a refill on your cardiac medications before your next appointment, please call your pharmacy*   Lab Work: NONE  Testing/Procedures: NONE  Follow-Up: As needed  Important Information About Sugar         Signed, Sinclair Grooms, MD  07/30/2022 5:01 PM    [place]

## 2022-07-30 ENCOUNTER — Ambulatory Visit: Payer: Medicare Other | Attending: Interventional Cardiology | Admitting: Interventional Cardiology

## 2022-07-30 ENCOUNTER — Encounter: Payer: Self-pay | Admitting: Interventional Cardiology

## 2022-07-30 VITALS — BP 106/58 | HR 67 | Ht 62.0 in | Wt 124.0 lb

## 2022-07-30 DIAGNOSIS — I471 Supraventricular tachycardia: Secondary | ICD-10-CM

## 2022-07-30 DIAGNOSIS — R9431 Abnormal electrocardiogram [ECG] [EKG]: Secondary | ICD-10-CM | POA: Diagnosis not present

## 2022-07-30 DIAGNOSIS — I493 Ventricular premature depolarization: Secondary | ICD-10-CM | POA: Diagnosis not present

## 2022-07-30 DIAGNOSIS — E785 Hyperlipidemia, unspecified: Secondary | ICD-10-CM | POA: Diagnosis not present

## 2022-07-30 DIAGNOSIS — R0609 Other forms of dyspnea: Secondary | ICD-10-CM | POA: Diagnosis not present

## 2022-07-30 MED ORDER — METOPROLOL SUCCINATE ER 25 MG PO TB24: 25.0000 mg | ORAL_TABLET | ORAL | 3 refills | Status: DC

## 2022-07-30 NOTE — Patient Instructions (Signed)
Medication Instructions:  Your physician has recommended you make the following change in your medication:   1) DECREASE metoprolol succinate (Toprol XL) '25mg'$  to every other day  *If you need a refill on your cardiac medications before your next appointment, please call your pharmacy*   Lab Work: NONE  Testing/Procedures: NONE  Follow-Up: As needed  Important Information About Sugar

## 2022-07-31 ENCOUNTER — Encounter: Payer: Self-pay | Admitting: Interventional Cardiology

## 2022-08-08 ENCOUNTER — Encounter: Payer: Self-pay | Admitting: Interventional Cardiology

## 2022-08-22 DIAGNOSIS — H401131 Primary open-angle glaucoma, bilateral, mild stage: Secondary | ICD-10-CM | POA: Diagnosis not present

## 2022-09-01 ENCOUNTER — Other Ambulatory Visit: Payer: Self-pay | Admitting: Primary Care

## 2022-09-01 DIAGNOSIS — M1990 Unspecified osteoarthritis, unspecified site: Secondary | ICD-10-CM

## 2022-09-01 DIAGNOSIS — M159 Polyosteoarthritis, unspecified: Secondary | ICD-10-CM

## 2022-09-02 NOTE — Telephone Encounter (Signed)
Unable to reach patient to verify if refill needed. Left voicemail to return call to our office.

## 2022-09-02 NOTE — Telephone Encounter (Signed)
Patient states she does need a refill. She takes 2 tab consistently everyday.

## 2022-09-23 DIAGNOSIS — Z23 Encounter for immunization: Secondary | ICD-10-CM | POA: Diagnosis not present

## 2022-10-01 DIAGNOSIS — H8111 Benign paroxysmal vertigo, right ear: Secondary | ICD-10-CM | POA: Diagnosis not present

## 2022-10-01 DIAGNOSIS — R42 Dizziness and giddiness: Secondary | ICD-10-CM | POA: Diagnosis not present

## 2022-10-14 DIAGNOSIS — Z23 Encounter for immunization: Secondary | ICD-10-CM | POA: Diagnosis not present

## 2022-10-23 DIAGNOSIS — H8111 Benign paroxysmal vertigo, right ear: Secondary | ICD-10-CM | POA: Diagnosis not present

## 2022-11-27 DIAGNOSIS — H818X3 Other disorders of vestibular function, bilateral: Secondary | ICD-10-CM | POA: Diagnosis not present

## 2022-12-03 DIAGNOSIS — H818X3 Other disorders of vestibular function, bilateral: Secondary | ICD-10-CM | POA: Diagnosis not present

## 2022-12-11 DIAGNOSIS — H818X3 Other disorders of vestibular function, bilateral: Secondary | ICD-10-CM | POA: Diagnosis not present

## 2022-12-18 ENCOUNTER — Telehealth: Payer: Self-pay | Admitting: Primary Care

## 2022-12-18 DIAGNOSIS — E785 Hyperlipidemia, unspecified: Secondary | ICD-10-CM

## 2022-12-18 NOTE — Telephone Encounter (Signed)
Lvmtcb, sent mychart message  

## 2022-12-18 NOTE — Telephone Encounter (Signed)
Patient is due for follow up in April, this will be required prior to any further refills.  Please schedule, thank you!   

## 2022-12-19 NOTE — Telephone Encounter (Signed)
Patient has been scheduled

## 2022-12-25 DIAGNOSIS — H818X3 Other disorders of vestibular function, bilateral: Secondary | ICD-10-CM | POA: Diagnosis not present

## 2023-01-08 DIAGNOSIS — M65341 Trigger finger, right ring finger: Secondary | ICD-10-CM | POA: Diagnosis not present

## 2023-01-20 DIAGNOSIS — Z96643 Presence of artificial hip joint, bilateral: Secondary | ICD-10-CM | POA: Diagnosis not present

## 2023-01-20 DIAGNOSIS — T8489XS Other specified complication of internal orthopedic prosthetic devices, implants and grafts, sequela: Secondary | ICD-10-CM | POA: Diagnosis not present

## 2023-01-20 DIAGNOSIS — M25352 Other instability, left hip: Secondary | ICD-10-CM | POA: Diagnosis not present

## 2023-01-22 DIAGNOSIS — M65341 Trigger finger, right ring finger: Secondary | ICD-10-CM | POA: Diagnosis not present

## 2023-02-20 DIAGNOSIS — H401131 Primary open-angle glaucoma, bilateral, mild stage: Secondary | ICD-10-CM | POA: Diagnosis not present

## 2023-02-20 DIAGNOSIS — H04123 Dry eye syndrome of bilateral lacrimal glands: Secondary | ICD-10-CM | POA: Diagnosis not present

## 2023-02-20 DIAGNOSIS — Z961 Presence of intraocular lens: Secondary | ICD-10-CM | POA: Diagnosis not present

## 2023-03-05 DIAGNOSIS — Z1283 Encounter for screening for malignant neoplasm of skin: Secondary | ICD-10-CM | POA: Diagnosis not present

## 2023-03-05 DIAGNOSIS — D225 Melanocytic nevi of trunk: Secondary | ICD-10-CM | POA: Diagnosis not present

## 2023-03-16 ENCOUNTER — Other Ambulatory Visit: Payer: Self-pay | Admitting: Primary Care

## 2023-03-16 DIAGNOSIS — R002 Palpitations: Secondary | ICD-10-CM

## 2023-03-16 DIAGNOSIS — E785 Hyperlipidemia, unspecified: Secondary | ICD-10-CM

## 2023-03-18 ENCOUNTER — Ambulatory Visit (INDEPENDENT_AMBULATORY_CARE_PROVIDER_SITE_OTHER): Payer: Medicare Other | Admitting: Primary Care

## 2023-03-18 ENCOUNTER — Encounter: Payer: Self-pay | Admitting: Primary Care

## 2023-03-18 ENCOUNTER — Other Ambulatory Visit: Payer: Self-pay | Admitting: Primary Care

## 2023-03-18 VITALS — BP 136/78 | HR 78 | Temp 97.2°F | Ht 62.0 in | Wt 128.0 lb

## 2023-03-18 DIAGNOSIS — E785 Hyperlipidemia, unspecified: Secondary | ICD-10-CM | POA: Diagnosis not present

## 2023-03-18 DIAGNOSIS — R002 Palpitations: Secondary | ICD-10-CM

## 2023-03-18 DIAGNOSIS — E559 Vitamin D deficiency, unspecified: Secondary | ICD-10-CM

## 2023-03-18 DIAGNOSIS — R0609 Other forms of dyspnea: Secondary | ICD-10-CM | POA: Diagnosis not present

## 2023-03-18 DIAGNOSIS — Z1231 Encounter for screening mammogram for malignant neoplasm of breast: Secondary | ICD-10-CM

## 2023-03-18 DIAGNOSIS — E2839 Other primary ovarian failure: Secondary | ICD-10-CM | POA: Diagnosis not present

## 2023-03-18 DIAGNOSIS — E875 Hyperkalemia: Secondary | ICD-10-CM

## 2023-03-18 DIAGNOSIS — Z96643 Presence of artificial hip joint, bilateral: Secondary | ICD-10-CM | POA: Diagnosis not present

## 2023-03-18 DIAGNOSIS — H409 Unspecified glaucoma: Secondary | ICD-10-CM | POA: Diagnosis not present

## 2023-03-18 DIAGNOSIS — M810 Age-related osteoporosis without current pathological fracture: Secondary | ICD-10-CM

## 2023-03-18 HISTORY — DX: Age-related osteoporosis without current pathological fracture: M81.0

## 2023-03-18 LAB — VITAMIN D 25 HYDROXY (VIT D DEFICIENCY, FRACTURES): VITD: 65.84 ng/mL (ref 30.00–100.00)

## 2023-03-18 LAB — COMPREHENSIVE METABOLIC PANEL
ALT: 15 U/L (ref 0–35)
AST: 23 U/L (ref 0–37)
Albumin: 4.6 g/dL (ref 3.5–5.2)
Alkaline Phosphatase: 63 U/L (ref 39–117)
BUN: 19 mg/dL (ref 6–23)
CO2: 28 mEq/L (ref 19–32)
Calcium: 9.5 mg/dL (ref 8.4–10.5)
Chloride: 104 mEq/L (ref 96–112)
Creatinine, Ser: 0.67 mg/dL (ref 0.40–1.20)
GFR: 82.69 mL/min (ref >60.00)
Glucose, Bld: 92 mg/dL (ref 70–99)
Potassium: 5.2 mEq/L — ABNORMAL HIGH (ref 3.5–5.1)
Sodium: 139 mEq/L (ref 135–145)
Total Bilirubin: 1 mg/dL (ref 0.2–1.2)
Total Protein: 6.9 g/dL (ref 6.0–8.3)

## 2023-03-18 LAB — LIPID PANEL
Cholesterol: 158 mg/dL (ref 0–200)
HDL: 68.2 mg/dL (ref >39.00)
LDL Cholesterol: 67 mg/dL (ref 0–99)
NonHDL: 89.6
Total CHOL/HDL Ratio: 2
Triglycerides: 111 mg/dL (ref 0.0–149.0)
VLDL: 22.2 mg/dL (ref 0.0–40.0)

## 2023-03-18 NOTE — Patient Instructions (Signed)
Stop by the lab prior to leaving today. I will notify you of your results once received.   [facility] your [facility] density scan.   It was a pleasure to see you today!   

## 2023-03-18 NOTE — Assessment & Plan Note (Signed)
Following with ophthalmology. ?Continue Xalatan 0.005% drops. ?

## 2023-03-18 NOTE — Assessment & Plan Note (Signed)
Improved.  Continue metoprolol succinate 25 mg daily. Reviewed cardiology notes form September 2023

## 2023-03-18 NOTE — Assessment & Plan Note (Signed)
Repeat lipid panel pending. Continue rosuvastatin 5 mg daily. 

## 2023-03-18 NOTE — Progress Notes (Signed)
Subjective:    Patient ID: Ann Estes, female    DOB: [birth year], 82 y.o.   MRN: [id number]  HPI  Ann Estes is a very pleasant 82 y.o. female with a history of osteoarthritis, hyperlipidemia, glaucoma, exertional dyspnea who presents today for follow up of chronic conditions.   Immunizations: -Tetanus: Completed in 2021 -Shingles: Completed Shingrix series -Pneumonia: Completed Prevnar 13 in 2014 and Pneumovax in 2016  Mammogram: August 2023 Bone Density Scan: August 2022   1) Osteoarthritis: Currently managed on Meloxicam 7.5 mg BID PRN. Overall feels well managed on this regimen and uses Meloxicam sparingly.   Following with orthopedics for trigger finger and for chronic hip pain. Due for repeat labs in June/July for evaluation of cobalt and chromium.   2) Exertional Dyspnea/Palpitations: Following with cardiology, last evaluated in September 2023. During this visit her metoprolol was continued at 25 mg every day. She underwent cardiac work up for symptomatic CAD which was negative.   3) Hyperlipidemia: Currently managed on rosuvastatin 5 mg daily. Due for repeat lipid panel.   4) Osteoporosis: Currently managed on ibandronate 150 mg once monthly. Her last bone density scan was in August of 2022    Review of Systems  Respiratory:  Negative for shortness of breath.   Cardiovascular:  Negative for chest pain and palpitations.  Musculoskeletal:  Positive for arthralgias.  Neurological:  Negative for dizziness and headaches.  Psychiatric/Behavioral:  The patient is nervous/anxious.          Past Medical History:  Diagnosis Date   Acute cholecystitis due to biliary calculus 03/10/2020   BPPV (benign paroxysmal positional vertigo)    Chicken pox    Glaucoma    Hyperlipidemia    Osteoarthritis    Osteoporosis 03/18/2023   Palpitations 10/13/2018    Social History   Socioeconomic History   Marital status: Divorced    Spouse name: Not on file   Number of  children: Not on file   Years of education: Not on file   Highest education level: Not on file  Occupational History   Occupation: retired  Tobacco Use   Smoking status: Former    Packs/day: 1.50    Years: 20.00    Additional pack years: 0.00    Total pack years: 30.00    Types: Cigarettes    Quit date: 02/09/1999    Years since quitting: 24.1   Smokeless tobacco: Never  Vaping Use   Vaping Use: Never used  Substance and Sexual Activity   Alcohol use: Yes    Comment: twice a year   Drug use: No   Sexual activity: Not Currently  Other Topics Concern   Not on file  Social History Narrative   Not on file   Social Determinants of Health   Financial Resource Strain: Low Risk  (03/28/2022)   Overall Financial Resource Strain (CARDIA)    Difficulty of Paying Living Expenses: Not hard at all  Food Insecurity: No Food Insecurity (03/28/2022)   Hunger Vital Sign    Worried About Running Out of Food in the Last Year: Never true    Ran Out of Food in the Last Year: Never true  Transportation Needs: No Transportation Needs (03/28/2022)   PRAPARE - Administrator, Civil Service (Medical): No    Lack of Transportation (Non-Medical): No  Physical Activity: Sufficiently Active (03/28/2022)   Exercise Vital Sign    Days of Exercise per Week: 7 days    Minutes of Exercise per Session:  30 min  Stress: No Stress Concern Present (03/28/2022)   [employer] of Occupational Health - Occupational Stress Questionnaire    Feeling of Stress : Not at all  Social Connections: Moderately Isolated (03/28/2022)   Social Connection and Isolation Panel [NHANES]    Frequency of Communication with Friends and Family: More than three times a week    Frequency of Social Gatherings with Friends and Family: More than three times a week    Attends Religious Services: Never    Database administrator or Organizations: Yes    Attends Engineer, structural: More than 4 times per year    Marital  Status: Divorced  Intimate Partner Violence: Not At Risk (03/28/2022)   Humiliation, Afraid, Rape, and Kick questionnaire    Fear of Current or Ex-Partner: No    Emotionally Abused: No    Physically Abused: No    Sexually Abused: No    Past Surgical History:  Procedure Laterality Date   AUGMENTATION MAMMAPLASTY Bilateral ?   silicone implants now removed    BREAST IMPLANT REMOVAL     CATARACT EXTRACTION Bilateral    CHOLECYSTECTOMY N/A 03/11/2020   Procedure: LAPAROSCOPIC CHOLECYSTECTOMY;  Surgeon: Berna Bue, MD;  Location: [facility];  Service: General;  Laterality: N/A;   COSMETIC SURGERY     Eyelids, tummy tuck   JOINT REPLACEMENT  both hips   you have this info   PLACEMENT OF BREAST IMPLANTS     TOTAL HIP ARTHROPLASTY Right 07/2004   TOTAL HIP ARTHROPLASTY Left 02/2005   TRIGGER FINGER RELEASE  11/2016   Thumb    Family History  Problem Relation Age of Onset   Heart disease Mother    Cancer Father    Birth defects Daughter    Arthritis Son    Cancer Maternal Grandmother    Heart attack Maternal Grandfather    Asthma Sister    Depression Sister    Asthma Sister     No Known Allergies  Current Outpatient Medications on File Prior to Visit  Medication Sig Dispense Refill   Alpha-Lipoic Acid 600 MG TABS Take 1 capsule by mouth daily at 12 noon.     b complex vitamins capsule Take by mouth.     Calcium Carbonate (CALCIUM 600 PO) Take 2 tablets by mouth daily at 6 (six) AM.     cholecalciferol (VITAMIN D3) 25 MCG (1000 UNIT) tablet Take 1,000 Units by mouth daily.     Green Tea, Camillia sinensis, (GREEN TEA PO) Take by mouth.      ibandronate (BONIVA) 150 MG tablet Take 1 tablet (150 mg total) by mouth every 30 (thirty) days. Take in the morning with a full glass of water, on an empty stomach, and do not take anything else by mouth for 30 min. Avoid laying flat for 2 hours. 3 tablet 3   latanoprost (XALATAN) 0.005 % ophthalmic solution Place 1 drop into both eyes at  bedtime.   2   metoprolol succinate (TOPROL XL) 25 MG 24 hr tablet Take 1 tablet (25 mg total) by mouth every other day. (Patient taking differently: Take 25 mg by mouth daily.) 45 tablet 3   Multiple Vitamins-Minerals (ONE DAILY WOMENS 50 PLUS) TABS      rosuvastatin (CRESTOR) 5 MG tablet TAKE 1 TABLET (5 MG TOTAL) BY MOUTH EVERY EVENING. FOR CHOLESTEROL. 90 tablet 0   meloxicam (MOBIC) 7.5 MG tablet TAKE 1 TABLET BY MOUTH TWICE A DAY AS NEEDED FOR PAIN (Patient  not taking: Reported on 03/18/2023) 180 tablet 0   No current facility-administered medications on file prior to visit.    BP 136/78   Pulse 78   Temp (!) 97.2 F (36.2 C) (Temporal)   Ht  (1.575 m)   Wt 128 lb (58.1 kg)   SpO2 97%   BMI 23.41 kg/m  Objective:   Physical Exam Cardiovascular:     Rate and Rhythm: Normal rate and regular rhythm.  Pulmonary:     Effort: Pulmonary effort is normal.     Breath sounds: Normal breath sounds.  Musculoskeletal:     Cervical back: Neck supple.  Skin:    General: Skin is warm and dry.           Assessment & Plan:  Hyperlipidemia, unspecified hyperlipidemia type Assessment & Plan: Repeat lipid panel pending. Continue rosuvastatin 5 mg daily.   Orders: -     Lipid panel -     Comprehensive metabolic panel  Screening mammogram for breast cancer  Estrogen deficiency -     DG Bone Density; Future  Vitamin D deficiency -     VITAMIN D 25 Hydroxy (Vit-D Deficiency, Fractures)  Exertional dyspnea Assessment & Plan: Evaluated by cardiology, reviewed office notes from September 2023. Negative cardiac work up.  Continue metoprolol succinate 25 mg daily.    Glaucoma, unspecified glaucoma type, unspecified laterality Assessment & Plan: Following with ophthalmology.   Continue Xalatan 0.005% drops.    History of bilateral hip arthroplasty Assessment & Plan: Following with orthopedics in [place]. Office notes and imaging reviewed from July  2023.   Osteoporosis without current pathological fracture, unspecified osteoporosis type Assessment & Plan: Continue Boniva 150 mg once monthly. Continue calcium and vitamin D.  Repeat bone density scan due in August 2024   Palpitations Assessment & Plan: Improved.  Continue metoprolol succinate 25 mg daily. Reviewed cardiology notes form September 2023         Doreene Nest, NP

## 2023-03-18 NOTE — Assessment & Plan Note (Signed)
Evaluated by cardiology, reviewed office notes from September 2023. Negative cardiac work up.  Continue metoprolol succinate 25 mg daily.

## 2023-03-18 NOTE — Assessment & Plan Note (Signed)
Following with orthopedics in [place]. Office notes and imaging reviewed from July 2023.

## 2023-03-18 NOTE — Assessment & Plan Note (Signed)
Continue Boniva 150 mg once monthly. Continue calcium and vitamin D.  Repeat bone density scan due in August 2024

## 2023-03-19 ENCOUNTER — Telehealth: Payer: Self-pay | Admitting: Primary Care

## 2023-03-19 NOTE — Telephone Encounter (Signed)
Pt called returning Kelli's missed card regarding results. Told pt Clark's response. Pt had no questions/concerns. Scheduled next lab visit for 03/26/23. Call back # [phone]

## 2023-03-19 NOTE — Telephone Encounter (Signed)
See further documentation in result note

## 2023-03-21 ENCOUNTER — Other Ambulatory Visit: Payer: Self-pay | Admitting: Primary Care

## 2023-03-26 ENCOUNTER — Other Ambulatory Visit: Payer: Medicare Other

## 2023-03-27 ENCOUNTER — Other Ambulatory Visit (INDEPENDENT_AMBULATORY_CARE_PROVIDER_SITE_OTHER): Payer: Medicare Other

## 2023-03-27 DIAGNOSIS — E875 Hyperkalemia: Secondary | ICD-10-CM | POA: Diagnosis not present

## 2023-03-27 LAB — POTASSIUM: Potassium: 5 mEq/L (ref 3.5–5.1)

## 2023-04-20 ENCOUNTER — Other Ambulatory Visit: Payer: Self-pay | Admitting: Primary Care

## 2023-04-20 DIAGNOSIS — R002 Palpitations: Secondary | ICD-10-CM

## 2023-06-05 DIAGNOSIS — M25551 Pain in right hip: Secondary | ICD-10-CM | POA: Diagnosis not present

## 2023-06-05 DIAGNOSIS — M25552 Pain in left hip: Secondary | ICD-10-CM | POA: Diagnosis not present

## 2023-06-11 ENCOUNTER — Other Ambulatory Visit: Payer: Self-pay | Admitting: Primary Care

## 2023-06-11 DIAGNOSIS — E785 Hyperlipidemia, unspecified: Secondary | ICD-10-CM

## 2023-06-11 DIAGNOSIS — M810 Age-related osteoporosis without current pathological fracture: Secondary | ICD-10-CM

## 2023-06-16 DIAGNOSIS — L258 Unspecified contact dermatitis due to other agents: Secondary | ICD-10-CM | POA: Diagnosis not present

## 2023-07-01 ENCOUNTER — Ambulatory Visit (INDEPENDENT_AMBULATORY_CARE_PROVIDER_SITE_OTHER): Payer: Medicare Other

## 2023-07-01 VITALS — Ht 62.0 in | Wt 130.0 lb

## 2023-07-01 DIAGNOSIS — Z0000 Encounter for general adult medical examination without abnormal findings: Secondary | ICD-10-CM | POA: Diagnosis not present

## 2023-07-01 NOTE — Progress Notes (Signed)
Subjective:   Ann Estes is a 82 y.o. female who presents for Medicare Annual (Subsequent) preventive examination.  Visit Complete: Virtual  I connected with  [name] on 07/01/23 by a audio enabled telemedicine application and verified that I am speaking with the correct person using two identifiers.  Patient Location: Home  Provider Location: Office/Clinic  I discussed the limitations of evaluation and management by telemedicine. The patient expressed understanding and agreed to proceed.  Patient Medicare AWV questionnaire was completed by the patient on 06/30/23; I have confirmed that all information answered by patient is correct and no changes since this date.  Vital Signs: Unable to obtain new vitals due to this being a telehealth visit.   Review of Systems      Cardiac Risk Factors include: advanced age (>23men, >61 women);dyslipidemia;sedentary lifestyle     Objective:    Today's Vitals   07/01/23 1046  Weight: 130 lb (59 kg)  Height: 5\' 2"  (1.575 m)   Body mass index is 23.78 kg/m.     07/01/2023   10:57 AM 03/28/2022    3:55 PM 03/02/2021    1:21 PM 02/29/2020    2:53 PM 02/04/2018   12:14 PM  Advanced Directives  Does Patient Have a Medical Advance Directive? Yes Yes Yes Yes Yes  Type of Estate agent of [place];Living will Healthcare Power of [place];Living will Healthcare Power of [place];Living will Healthcare Power of [place];Living will Healthcare Power of Attorney  Does patient want to make changes to medical advance directive? No - Patient declined      Copy of Healthcare Power of Attorney in Chart? Yes - validated most recent copy scanned in chart (See row information) No - copy requested No - copy requested No - copy requested No - copy requested    Current Medications (verified) Outpatient Encounter Medications as of 07/01/2023  Medication Sig   Acetaminophen (TYLENOL 8 HOUR PO) Take by mouth.   b complex vitamins capsule  Take by mouth.   Calcium Carbonate (CALCIUM 600 PO) Take 2 tablets by mouth daily at 6 (six) AM.   cholecalciferol (VITAMIN D3) 25 MCG (1000 UNIT) tablet Take 1,000 Units by mouth daily.   Green Tea, Camillia sinensis, (GREEN TEA PO) Take by mouth.    ibandronate (BONIVA) 150 MG tablet TAKE 1 TABLET (150 MG TOTAL) BY MOUTH EVERY 30 (THIRTY) DAYS. TAKE IN THE MORNING WITH A FULL GLASS OF WATER, ON AN EMPTY STOMACH, AND DO NOT TAKE ANYTHING ELSE BY MOUTH FOR 30 MIN. AVOID LAYING FLAT FOR 2 HOURS.   latanoprost (XALATAN) 0.005 % ophthalmic solution Place 1 drop into both eyes at bedtime.    metoprolol succinate (TOPROL-XL) 25 MG 24 hr tablet TAKE 1 TABLET (25 MG TOTAL) BY MOUTH DAILY. FOR PALPITATIONS   Multiple Vitamins-Minerals (ONE DAILY WOMENS 50 PLUS) TABS    NAPROXEN PO Take by mouth.   rosuvastatin (CRESTOR) 5 MG tablet TAKE 1 TABLET (5 MG TOTAL) BY MOUTH EVERY EVENING. FOR CHOLESTEROL.   Alpha-Lipoic Acid 600 MG TABS Take 1 capsule by mouth daily at 12 noon.   meloxicam (MOBIC) 7.5 MG tablet TAKE 1 TABLET BY MOUTH TWICE A DAY AS NEEDED FOR PAIN (Patient not taking: Reported on 03/18/2023)   No facility-administered encounter medications on file as of 07/01/2023.    Allergies (verified) Patient has no known allergies.   History: Past Medical History:  Diagnosis Date   Acute cholecystitis due to biliary calculus 03/10/2020   BPPV (benign paroxysmal  positional vertigo)    Chicken pox    Glaucoma    Hyperlipidemia    Osteoarthritis    Osteoporosis 03/18/2023   Palpitations 10/13/2018   Past Surgical History:  Procedure Laterality Date   AUGMENTATION MAMMAPLASTY Bilateral ?   silicone implants now removed    BREAST IMPLANT REMOVAL     CATARACT EXTRACTION Bilateral    CHOLECYSTECTOMY N/A 03/11/2020   Procedure: LAPAROSCOPIC CHOLECYSTECTOMY;  Surgeon: Berna Bue, MD;  Location: [facility] OR;  Service: General;  Laterality: N/A;   COSMETIC SURGERY     Eyelids, tummy tuck   JOINT  REPLACEMENT  both hips   you have this info   PLACEMENT OF BREAST IMPLANTS     TOTAL HIP ARTHROPLASTY Right 07/2004   TOTAL HIP ARTHROPLASTY Left 02/2005   TRIGGER FINGER RELEASE  11/2016   Thumb   Family History  Problem Relation Age of Onset   Heart disease Mother    Cancer Father    Birth defects Daughter    Arthritis Son    Cancer Maternal Grandmother    Heart attack Maternal Grandfather    Asthma Sister    Depression Sister    Asthma Sister    Social History   Socioeconomic History   Marital status: Divorced    Spouse name: Not on file   Number of children: Not on file   Years of education: Not on file   Highest education level: Not on file  Occupational History   Occupation: retired  Tobacco Use   Smoking status: Former    Current packs/day: 0.00    Average packs/day: 1.5 packs/day for 20.0 years (30.0 ttl pk-yrs)    Types: Cigarettes    Start date: 02/09/1979    Quit date: 02/09/1999    Years since quitting: 24.4   Smokeless tobacco: Never  Vaping Use   Vaping status: Never Used  Substance and Sexual Activity   Alcohol use: Yes    Comment: twice a year   Drug use: No   Sexual activity: Not Currently  Other Topics Concern   Not on file  Social History Narrative   Not on file   Social Determinants of Health   Financial Resource Strain: Low Risk  (07/01/2023)   Overall Financial Resource Strain (CARDIA)    Difficulty of Paying Living Expenses: Not hard at all  Food Insecurity: No Food Insecurity (07/01/2023)   Hunger Vital Sign    Worried About Running Out of Food in the Last Year: Never true    Ran Out of Food in the Last Year: Never true  Transportation Needs: No Transportation Needs (07/01/2023)   PRAPARE - Administrator, Civil Service (Medical): No    Lack of Transportation (Non-Medical): No  Physical Activity: Insufficiently Active (07/01/2023)   Exercise Vital Sign    Days of Exercise per Week: 7 days    Minutes of Exercise per Session:  20 min  Stress: No Stress Concern Present (07/01/2023)   [employer] of Occupational Health - Occupational Stress Questionnaire    Feeling of Stress : Not at all  Social Connections: Socially Isolated (07/01/2023)   Social Connection and Isolation Panel [NHANES]    Frequency of Communication with Friends and Family: More than three times a week    Frequency of Social Gatherings with Friends and Family: More than three times a week    Attends Religious Services: Never    Database administrator or Organizations: No    Attends [employer]  or Organization Meetings: Never    Marital Status: Divorced    Tobacco Counseling Counseling given: Not Answered   Clinical Intake:  Pre-visit preparation completed: Yes  Pain : No/denies pain     BMI - recorded: 23.78 Nutritional Status: BMI of 19-24  Normal Nutritional Risks: None Diabetes: No  How often do you need to have someone help you when you read instructions, pamphlets, or other written materials from your doctor or pharmacy?: 1 - Never  Interpreter Needed?: No  Information entered by :: C.Aveyah Greenwood LPN   Activities of Daily Living    06/30/2023    2:59 PM  In your present state of health, do you have any difficulty performing the following activities:  Hearing? 0  Vision? 0  Difficulty concentrating or making decisions? 0  Walking or climbing stairs? 0  Dressing or bathing? 0  Doing errands, shopping? 0  Preparing Food and eating ? N  Using the Toilet? N  In the past six months, have you accidently leaked urine? N  Do you have problems with loss of bowel control? N  Managing your Medications? N  Managing your Finances? N  Housekeeping or managing your Housekeeping? N    Patient Care Team: Doreene Nest, NP as PCP - General (Internal Medicine) Lyn Records, MD (Inactive) as PCP - Cardiology (Cardiology) Lockie Mola, MD as Referring Physician (Ophthalmology)  Indicate any recent Medical Services you may  have received from other than [facility] providers in the past year (date may be approximate).     Assessment:   This is a routine wellness examination for Dezarae.  Hearing/Vision screen Hearing Screening - Comments:: Has slight hearing difficulties Vision Screening - Comments:: Glasses- [facility] - UTD on eye exams- has Glaucoma  Dietary issues and exercise activities discussed:     Goals Addressed   None    Depression Screen    07/01/2023   10:56 AM 03/18/2023   11:22 AM 03/28/2022    3:56 PM 03/12/2022   10:28 AM 03/02/2021    1:22 PM 02/29/2020    2:55 PM 02/15/2019   12:03 PM  PHQ 2/9 Scores  PHQ - 2 Score 0 0 1 1 0 0 0  PHQ- 9 Score   3 3 0 0     Fall Risk    06/30/2023    2:59 PM 03/18/2023   11:22 AM 03/12/2022   10:29 AM 03/02/2021    1:22 PM 02/29/2020    2:54 PM  Fall Risk   Falls in the past year? 0 0 0 0 0  Number falls in past yr: 0 0 0 0 0  Injury with Fall? 0 0 0 0 0  Risk for fall due to : No Fall Risks No Fall Risks  Medication side effect Medication side effect  Follow up Falls evaluation completed;Falls prevention discussed Falls evaluation completed  Falls evaluation completed;Falls prevention discussed Falls evaluation completed;Falls prevention discussed    MEDICARE RISK AT HOME:   TIMED UP AND GO:  Was the test performed?  No    Cognitive Function:    03/02/2021    1:28 PM 02/29/2020    2:59 PM 02/04/2018   11:59 AM  MMSE - Mini Mental State Exam  Orientation to time 5 5 5   Orientation to Place 5 5 5   Registration 3 3 3   Attention/ Calculation 5 5 0  Recall 3 3 3   Language- name 2 objects   0  Language- repeat 1 1 1  Language- follow 3 step command   3  Language- read & follow direction   0  Write a sentence   0  Copy design   0  Total score   20        07/01/2023   10:59 AM 03/28/2022    3:24 PM  6CIT Screen  What Year? 0 points 0 points  What month? 0 points 0 points  What time? 0 points 0 points  Count back from 20 0 points 0 points   Months in reverse 0 points 0 points  Repeat phrase 0 points 0 points  Total Score 0 points 0 points    Immunizations Immunization History  Administered Date(s) Administered   Fluad Quad(high Dose 65+) 08/06/2019, 09/15/2020, 09/13/2021   Influenza, High Dose Seasonal PF 10/05/2018   Influenza,inj,Quad PF,6+ Mos 08/11/2017   PFIZER Comirnaty(Gray Top)Covid-19 Tri-Sucrose Vaccine 02/21/2021   PFIZER(Purple Top)SARS-COV-2 Vaccination 12/04/2019, 12/25/2019, 08/25/2020   Pfizer Covid-19 Vaccine Bivalent Booster 7yrs & up 08/08/2021, 04/03/2022   Pneumococcal Conjugate-13 11/13/2013   Pneumococcal Polysaccharide-23 12/22/2014   Td 07/23/2010   Tdap 03/07/2020   Zoster Recombinant(Shingrix) 05/19/2018, 09/26/2018   Zoster, Live 06/27/2009    TDAP status: Up to date  Flu Vaccine status: Due, Education has been provided regarding the importance of this vaccine. Advised may receive this vaccine at local pharmacy or Health Dept. Aware to provide a copy of the vaccination record if obtained from local pharmacy or Health Dept. Verbalized acceptance and understanding.  Pneumococcal vaccine status: Up to date  Covid-19 vaccine status: Information provided on how to obtain vaccines.   Qualifies for Shingles Vaccine? Yes   Zostavax completed Yes   Shingrix Completed?: Yes  Screening Tests Health Maintenance  Topic Date Due   COVID-19 Vaccine (8 - 2023-24 season) 02/12/2023   INFLUENZA VACCINE  06/26/2023   DEXA SCAN  07/19/2023   Medicare Annual Wellness (AWV)  06/30/2024   MAMMOGRAM  07/16/2024   DTaP/Tdap/Td (3 - Td or Tdap) 03/07/2030   Pneumonia Vaccine 79+ Years old  Completed   Zoster Vaccines- Shingrix  Completed   HPV VACCINES  Aged Out   Hepatitis C Screening  Discontinued    Health Maintenance  Health Maintenance Due  Topic Date Due   COVID-19 Vaccine (8 - 2023-24 season) 02/12/2023   INFLUENZA VACCINE  06/26/2023    Colorectal cancer screening: No longer  required.   Mammogram status: Completed 07/16/22. Repeat every year Pt elects to do mammograms every 2 years.  Bone Density status: Completed 07/18/21. Results reflect: Bone density results: OSTEOPOROSIS. Repeat every 2 years. Scheduled for 08/06/23  Lung Cancer Screening: (Low Dose CT Chest recommended if Age 61-80 years, 20 pack-year currently smoking OR have quit w/in 15years.) does not qualify.   Lung Cancer Screening Referral: no  Additional Screening:  Hepatitis C Screening: does qualify; Completed 03/02/22  Vision Screening: Recommended annual ophthalmology exams for early detection of glaucoma and other disorders of the eye. Is the patient up to date with their annual eye exam?  Yes  Who is the provider or what is the name of the office in which the patient attends annual eye exams? [facility] If pt is not established with a provider, would they like to be referred to a provider to establish care? Yes .   Dental Screening: Recommended annual dental exams for proper oral hygiene    Community Resource Referral / Chronic Care Management: CRR required this visit?  No   CCM required this visit?  No     Plan:     I have personally reviewed and noted the following in the patient's chart:   Medical and social history Use of alcohol, tobacco or illicit drugs  Current medications and supplements including opioid prescriptions. Patient is not currently taking opioid prescriptions. Functional ability and status Nutritional status Physical activity Advanced directives List of other physicians Hospitalizations, surgeries, and ER visits in previous 12 months Vitals Screenings to include cognitive, depression, and falls Referrals and appointments  In addition, I have reviewed and discussed with patient certain preventive protocols, quality metrics, and best practice recommendations. A written personalized care plan for preventive services as well as general preventive health  recommendations were provided to patient.     Maryan Puls, LPN   01/03/5283   After Visit Summary: (MyChart) Due to this being a telephonic visit, the after visit summary with patients personalized plan was offered to patient via MyChart   Nurse Notes: none

## 2023-07-01 NOTE — Patient Instructions (Signed)
Ann Estes , Thank you for taking time to come for your Medicare Wellness Visit. I appreciate your ongoing commitment to your health goals. Please review the following plan we discussed and let me know if I can assist you in the future.   Referrals/Orders/Follow-Ups/Clinician Recommendations: Aim for 30 minutes of exercise or brisk walking, 6-8 glasses of water, and 5 servings of fruits and vegetables each day.   This is a list of the screening recommended for you and due dates:  Health Maintenance  Topic Date Due   COVID-19 Vaccine (8 - 2023-24 season) 02/12/2023   Medicare Annual Wellness Visit  03/29/2023   Flu Shot  06/26/2023   DEXA scan (bone density measurement)  07/19/2023   Mammogram  07/16/2024   DTaP/Tdap/Td vaccine (3 - Td or Tdap) 03/07/2030   Pneumonia Vaccine  Completed   Zoster (Shingles) Vaccine  Completed   HPV Vaccine  Aged Out   Hepatitis C Screening  Discontinued    Advanced directives: (In Chart) A copy of your advanced directives are scanned into your chart should your provider ever need it.  Next Medicare Annual Wellness Visit scheduled for next year: Yes  Preventive Care 80 Years and Older, Female Preventive care refers to lifestyle choices and visits with your health care provider that can promote health and wellness. What does preventive care include? A yearly physical exam. This is also called an annual well check. Dental exams once or twice a year. Routine eye exams. Ask your health care provider how often you should have your eyes checked. Personal lifestyle choices, including: Daily care of your teeth and gums. Regular physical activity. Eating a healthy diet. Avoiding tobacco and drug use. Limiting alcohol use. Practicing safe sex. Taking low-dose aspirin every day. Taking vitamin and mineral supplements as recommended by your health care provider. What happens during an annual well check? The services and screenings done by your health care  provider during your annual well check will depend on your age, overall health, lifestyle risk factors, and family history of disease. Counseling  Your health care provider may ask you questions about your: Alcohol use. Tobacco use. Drug use. Emotional well-being. Home and relationship well-being. Sexual activity. Eating habits. History of falls. Memory and ability to understand (cognition). Work and work Astronomer. Reproductive health. Screening  You may have the following tests or measurements: Height, weight, and BMI. Blood pressure. Lipid and cholesterol levels. These may be checked every 5 years, or more frequently if you are over 31 years old. Skin check. Lung cancer screening. You may have this screening every year starting at age 80 if you have a 30-pack-year history of smoking and currently smoke or have quit within the past 15 years. Fecal occult blood test (FOBT) of the stool. You may have this test every year starting at age 80. Flexible sigmoidoscopy or colonoscopy. You may have a sigmoidoscopy every 5 years or a colonoscopy every 10 years starting at age 80. Hepatitis C blood test. Hepatitis B blood test. Sexually transmitted disease (STD) testing. Diabetes screening. This is done by checking your blood sugar (glucose) after you have not eaten for a while (fasting). You may have this done every 1-3 years. Bone density scan. This is done to screen for osteoporosis. You may have this done starting at age 80. Mammogram. This may be done every 1-2 years. Talk to your health care provider about how often you should have regular mammograms. Talk with your health care provider about your test results, treatment  options, and if necessary, the need for more tests. Vaccines  Your health care provider may recommend certain vaccines, such as: Influenza vaccine. This is recommended every year. Tetanus, diphtheria, and acellular pertussis (Tdap, Td) vaccine. You may need a Td  booster every 10 years. Zoster vaccine. You may need this after age 80. Pneumococcal 13-valent conjugate (PCV13) vaccine. One dose is recommended after age 80. Pneumococcal polysaccharide (PPSV23) vaccine. One dose is recommended after age 80. Talk to your health care provider about which screenings and vaccines you need and how often you need them. This information is not intended to replace advice given to you by your health care provider. Make sure you discuss any questions you have with your health care provider. Document Released: 12/08/2015 Document Revised: 07/31/2016 Document Reviewed: 09/12/2015 Elsevier Interactive Patient Education  2017 [employer].  Fall Prevention in the Home Falls can cause injuries. They can happen to people of all ages. There are many things you can do to make your home safe and to help prevent falls. What can I do on the outside of my home? Regularly fix the edges of walkways and driveways and fix any cracks. Remove anything that might make you trip as you walk through a door, such as a raised step or threshold. Trim any bushes or trees on the path to your home. Use bright outdoor lighting. Clear any walking paths of anything that might make someone trip, such as rocks or tools. Regularly check to see if handrails are loose or broken. Make sure that both sides of any steps have handrails. Any raised decks and porches should have guardrails on the edges. Have any leaves, snow, or ice cleared regularly. Use sand or salt on walking paths during winter. Clean up any spills in your garage right away. This includes oil or grease spills. What can I do in the bathroom? Use night lights. Install grab bars by the toilet and in the tub and shower. Do not use towel bars as grab bars. Use non-skid mats or decals in the tub or shower. If you need to sit down in the shower, use a plastic, non-slip stool. Keep the floor dry. Clean up any water that spills on the floor  as soon as it happens. Remove soap buildup in the tub or shower regularly. Attach bath mats securely with double-sided non-slip rug tape. Do not have throw rugs and other things on the floor that can make you trip. What can I do in the bedroom? Use night lights. Make sure that you have a light by your bed that is easy to reach. Do not use any sheets or blankets that are too big for your bed. They should not hang down onto the floor. Have a firm chair that has side arms. You can use this for support while you get dressed. Do not have throw rugs and other things on the floor that can make you trip. What can I do in the kitchen? Clean up any spills right away. Avoid walking on wet floors. Keep items that you use a lot in easy-to-reach places. If you need to reach something above you, use a strong step stool that has a grab bar. Keep electrical cords out of the way. Do not use floor polish or wax that makes floors slippery. If you must use wax, use non-skid floor wax. Do not have throw rugs and other things on the floor that can make you trip. What can I do with my stairs? Do not  leave any items on the stairs. Make sure that there are handrails on both sides of the stairs and use them. Fix handrails that are broken or loose. Make sure that handrails are as long as the stairways. Check any carpeting to make sure that it is firmly attached to the stairs. Fix any carpet that is loose or worn. Avoid having throw rugs at the top or bottom of the stairs. If you do have throw rugs, attach them to the floor with carpet tape. Make sure that you have a light switch at the top of the stairs and the bottom of the stairs. If you do not have them, ask someone to add them for you. What else can I do to help prevent falls? Wear shoes that: Do not have high heels. Have rubber bottoms. Are comfortable and fit you well. Are closed at the toe. Do not wear sandals. If you use a stepladder: Make sure that it is  fully opened. Do not climb a closed stepladder. Make sure that both sides of the stepladder are locked into place. Ask someone to hold it for you, if possible. Clearly mark and make sure that you can see: Any grab bars or handrails. First and last steps. Where the edge of each step is. Use tools that help you move around (mobility aids) if they are needed. These include: Canes. Walkers. Scooters. Crutches. Turn on the lights when you go into a dark area. Replace any light bulbs as soon as they burn out. Set up your furniture so you have a clear path. Avoid moving your furniture around. If any of your floors are uneven, fix them. If there are any pets around you, be aware of where they are. Review your medicines with your doctor. Some medicines can make you feel dizzy. This can increase your chance of falling. Ask your doctor what other things that you can do to help prevent falls. This information is not intended to replace advice given to you by your health care provider. Make sure you discuss any questions you have with your health care provider. Document Released: 09/07/2009 Document Revised: 04/18/2016 Document Reviewed: 12/16/2014 Elsevier Interactive Patient Education  2017 [employer].

## 2023-08-06 ENCOUNTER — Ambulatory Visit
Admission: RE | Admit: 2023-08-06 | Discharge: 2023-08-06 | Disposition: A | Discharge status: Home / Self Care | Payer: Medicare Other | Source: Ambulatory Visit | Attending: Primary Care | Admitting: Primary Care

## 2023-08-06 DIAGNOSIS — E2839 Other primary ovarian failure: Secondary | ICD-10-CM | POA: Diagnosis not present

## 2023-08-06 DIAGNOSIS — M810 Age-related osteoporosis without current pathological fracture: Secondary | ICD-10-CM | POA: Diagnosis not present

## 2023-08-25 DIAGNOSIS — H401131 Primary open-angle glaucoma, bilateral, mild stage: Secondary | ICD-10-CM | POA: Diagnosis not present

## 2023-08-26 DIAGNOSIS — Z23 Encounter for immunization: Secondary | ICD-10-CM | POA: Diagnosis not present

## 2023-09-01 DIAGNOSIS — Z961 Presence of intraocular lens: Secondary | ICD-10-CM | POA: Diagnosis not present

## 2023-09-01 DIAGNOSIS — H401131 Primary open-angle glaucoma, bilateral, mild stage: Secondary | ICD-10-CM | POA: Diagnosis not present

## 2023-09-01 DIAGNOSIS — H04123 Dry eye syndrome of bilateral lacrimal glands: Secondary | ICD-10-CM | POA: Diagnosis not present

## 2023-10-10 ENCOUNTER — Ambulatory Visit: Payer: Medicare Other | Admitting: Cardiology

## 2023-10-16 DIAGNOSIS — Z96643 Presence of artificial hip joint, bilateral: Secondary | ICD-10-CM | POA: Diagnosis not present

## 2023-10-16 DIAGNOSIS — Z471 Aftercare following joint replacement surgery: Secondary | ICD-10-CM | POA: Diagnosis not present

## 2023-10-17 DIAGNOSIS — Z23 Encounter for immunization: Secondary | ICD-10-CM | POA: Diagnosis not present

## 2023-12-16 ENCOUNTER — Ambulatory Visit: Payer: Medicare Other | Admitting: Physician Assistant

## 2024-02-08 ENCOUNTER — Other Ambulatory Visit: Payer: Self-pay | Admitting: Primary Care

## 2024-02-08 DIAGNOSIS — M810 Age-related osteoporosis without current pathological fracture: Secondary | ICD-10-CM

## 2024-02-08 NOTE — Telephone Encounter (Signed)
 Patient is due for follow up in late April, this will be required prior to any further refills.  Please schedule, thank you!

## 2024-02-09 NOTE — Telephone Encounter (Signed)
 LVM for patient to c/b and schedule.

## 2024-02-09 NOTE — Progress Notes (Unsigned)
 Cardiology Office Note    Date:  02/10/2024  ID:  Ann Estes, DOB 1941/11/09, MRN [id number] PCP:  Doreene Nest, NP  Cardiologist:  Lesleigh Noe, MD (Inactive)  Electrophysiologist:  None   Chief Complaint: f/u PVCs  History of Present Illness: .    Ann Estes is a 83 y.o. female with visit-pertinent history of abdominal aortic atherosclerosis, PVCs, NSVT, PSVT, PACs, HLD seen for follow-up. She was previously seen here for PVCs and dyspnea on exertion by Dr. Katrinka Blazing. Monitor 03/2019 NSR, frequent PVCs (7.2%, occuring isolated, in bigeminy, and occasional couplets, triplets), 15 runs of NSVT (longest 9 beats), 2 PSVT, rare PACs. 2D echo 04/2019 showed EF 55-60%, impaired relaxation, elevated LAP/LVEDP. Nuclear stress test 04/2019 was normal, EF 58%. She was started on metoprolol with good clinical response.  She is seen for follow-up today feeling well without any interim cardiac complaints. Palpitations are well controlled on metoprolol - last episode was around July 2024 when she moved her dose from PM to AM. She denies any DOE, CP, syncope, edema. No acute concerns otherwise from establishing with new cardiologist upon Dr. Michaelle Copas retirement.  Labwork independently reviewed: 03/2023 K 5.0 02/2023, Cr 0.67, LFTs ok, LDL 57, trig 111 PCP 04/2022 BNP 108, CBC wnl  ROS: .    Please see the history of present illness. Otherwise, review of systems is positive for .  All other systems are reviewed and otherwise negative.  Studies Reviewed: Marland Kitchen    EKG:  EKG is ordered today, personally reviewed, demonstrating:  EKG Interpretation Date/Time:  Tuesday February 10 2024 11:51:27 EDT Ventricular Rate:  78 PR Interval:  174 QRS Duration:  84 QT Interval:  390 QTC Calculation: 444 R Axis:   50  Text Interpretation: Sinus rhythm with occasional Premature ventricular complexes Septal infarct , age undetermined Nonspecific ST and T wave abnormality similar to prior (nonspecific ST sagging  inferiorly, V5-V6) Confirmed by Ronie Spies [phone]) on 02/10/2024 12:03:29 PM   CV Studies: Cardiac studies reviewed are outlined and summarized above. Otherwise please see EMR for full report.   Current Reported Medications:.    Current Meds  Medication Sig   Acetaminophen (TYLENOL 8 HOUR PO) Take by mouth.   b complex vitamins capsule Take by mouth.   Calcium Carbonate (CALCIUM 600 PO) Take 2 tablets by mouth daily at 6 (six) AM.   cholecalciferol (VITAMIN D3) 25 MCG (1000 UNIT) tablet Take 1,000 Units by mouth daily.   Green Tea, Camillia sinensis, (GREEN TEA PO) Take by mouth.    ibandronate (BONIVA) 150 MG tablet TAKE 1 TABLET (150 MG TOTAL) BY MOUTH EVERY 30 (THIRTY) DAYS. TAKE IN THE MORNING WITH A FULL GLASS OF WATER, ON AN EMPTY STOMACH, AND DO NOT TAKE ANYTHING ELSE BY MOUTH FOR 30 MIN. AVOID LAYING FLAT FOR 2 HOURS.   latanoprost (XALATAN) 0.005 % ophthalmic solution Place 1 drop into both eyes at bedtime.    metoprolol succinate (TOPROL-XL) 25 MG 24 hr tablet TAKE 1 TABLET (25 MG TOTAL) BY MOUTH DAILY. FOR PALPITATIONS   Multiple Vitamins-Minerals (ONE DAILY WOMENS 50 PLUS) TABS    NAPROXEN PO Take by mouth.   rosuvastatin (CRESTOR) 5 MG tablet TAKE 1 TABLET (5 MG TOTAL) BY MOUTH EVERY EVENING. FOR CHOLESTEROL.    Physical Exam:    VS:  BP 118/60   Pulse 78   Ht 5\' 2"  (1.575 m)   Wt 134 lb 6.4 oz (61 kg)   SpO2 99%   BMI  24.58 kg/m    Wt Readings from Last 3 Encounters:  02/10/24 134 lb 6.4 oz (61 kg)  07/01/23 130 lb (59 kg)  03/18/23 128 lb (58.1 kg)    GEN: Well nourished, well developed in no acute distress NECK: No JVD; No carotid bruits CARDIAC: RRR, no murmurs, rubs, gallops RESPIRATORY:  Clear to auscultation without rales, wheezing or rhonchi  ABDOMEN: Soft, non-tender, non-distended EXTREMITIES:  No edema; No acute deformity   Asessement and Plan:.    1. Dyspnea on exertion - resolved without recurrence. Reassuring cardiac testing outlined above. No  further workup needed at this time.  2. PVCs, NSVT - one PVC noted on EKG but none on auscultation today. Symptoms are generally well controlled on metoprolol. No other high risk symptoms at this time. EF was normal and nuclear stress test was reassuring. Continue metoprolol 25mg  daily. Update BMET, Mg, TSH today.  3. PACs, PSVT - quiescent.  4. Aortic atherosclerosis, HLD - tolerating rosuvastatin well. Lipids followed by PCP. She plans to see primary care for physical later in April.     Disposition: F/u with Dr .Mayford Knife in 1 year to establish care (patient prefers female provider).  Signed, Laurann Montana, PA-C

## 2024-02-09 NOTE — Telephone Encounter (Signed)
 Patient has been scheduled

## 2024-02-10 ENCOUNTER — Encounter: Payer: Self-pay | Admitting: Physician Assistant

## 2024-02-10 ENCOUNTER — Ambulatory Visit: Payer: Medicare Other | Attending: Physician Assistant | Admitting: Physician Assistant

## 2024-02-10 VITALS — BP 118/60 | HR 78 | Ht 62.0 in | Wt 134.4 lb

## 2024-02-10 DIAGNOSIS — I491 Atrial premature depolarization: Secondary | ICD-10-CM

## 2024-02-10 DIAGNOSIS — I4729 Other ventricular tachycardia: Secondary | ICD-10-CM

## 2024-02-10 DIAGNOSIS — I471 Supraventricular tachycardia, unspecified: Secondary | ICD-10-CM | POA: Diagnosis not present

## 2024-02-10 DIAGNOSIS — I493 Ventricular premature depolarization: Secondary | ICD-10-CM

## 2024-02-10 DIAGNOSIS — R0609 Other forms of dyspnea: Secondary | ICD-10-CM

## 2024-02-10 DIAGNOSIS — I700 Atherosclerosis of aorta: Secondary | ICD-10-CM | POA: Diagnosis not present

## 2024-02-10 DIAGNOSIS — E785 Hyperlipidemia, unspecified: Secondary | ICD-10-CM

## 2024-02-10 NOTE — Patient Instructions (Signed)
 Medication Instructions:  Your physician recommends that you continue on your current medications as directed. Please refer to the Current Medication list given to you today.  *If you need a refill on your cardiac medications before your next appointment, please call your pharmacy*   Lab Work: TODAY:  BMET & MAG If you have labs (blood work) drawn today and your tests are completely normal, you will receive your results only by: MyChart Message (if you have MyChart) OR A paper copy in the mail If you have any lab test that is abnormal or we need to change your treatment, we will call you to review the results.   Testing/Procedures: None ordered   Follow-Up: At [facility], you and your health needs are our priority.  As part of our continuing mission to provide you with exceptional heart care, we have created designated Provider Care Teams.  These Care Teams include your primary Cardiologist (physician) and Advanced Practice Providers (APPs -  Physician Assistants and Nurse Practitioners) who all work together to provide you with the care you need, when you need it.  We recommend signing up for the patient portal called "MyChart".  Sign up information is provided on this After Visit Summary.  MyChart is used to connect with patients for Virtual Visits (Telemedicine).  Patients are able to view lab/test results, encounter notes, upcoming appointments, etc.  Non-urgent messages can be sent to your provider as well.   To learn more about what you can do with MyChart, go to [email or web address].    Your next appointment:   1 year(s)  Provider:   Armanda Magic, MD     Other Instructions     1st Floor: - Lobby - Registration  - Pharmacy  - Lab - Cafe  2nd Floor: - PV Lab - Diagnostic Testing (echo, CT, nuclear med)  3rd Floor: - Vacant  4th Floor: - TCTS (cardiothoracic surgery) - AFib Clinic - Structural Heart Clinic - Vascular Surgery  - Vascular  Ultrasound  5th Floor: - [facility] (general and EP) - Clinical Pharmacy for coumadin, hypertension, lipid, weight-loss medications, and med management appointments    Valet parking services will be available as well.

## 2024-02-11 LAB — MAGNESIUM: Magnesium: 2.2 mg/dL (ref 1.6–2.3)

## 2024-02-11 LAB — BASIC METABOLIC PANEL
BUN/Creatinine Ratio: 30 — ABNORMAL HIGH (ref 12–28)
BUN: 19 mg/dL (ref 8–27)
CO2: 26 mmol/L (ref 20–29)
Calcium: 9.5 mg/dL (ref 8.7–10.3)
Chloride: 102 mmol/L (ref 96–106)
Creatinine, Ser: 0.63 mg/dL (ref 0.57–1.00)
Glucose: 86 mg/dL (ref 70–99)
Potassium: 4.2 mmol/L (ref 3.5–5.2)
Sodium: 142 mmol/L (ref 134–144)
eGFR: 89 mL/min/{1.73_m2} (ref >59)

## 2024-02-11 LAB — TSH: TSH: 0.83 u[IU]/mL (ref 0.450–4.500)

## 2024-02-29 ENCOUNTER — Other Ambulatory Visit: Payer: Self-pay | Admitting: Primary Care

## 2024-02-29 DIAGNOSIS — E785 Hyperlipidemia, unspecified: Secondary | ICD-10-CM

## 2024-03-02 DIAGNOSIS — H43813 Vitreous degeneration, bilateral: Secondary | ICD-10-CM | POA: Diagnosis not present

## 2024-03-02 DIAGNOSIS — H401131 Primary open-angle glaucoma, bilateral, mild stage: Secondary | ICD-10-CM | POA: Diagnosis not present

## 2024-03-02 DIAGNOSIS — H04123 Dry eye syndrome of bilateral lacrimal glands: Secondary | ICD-10-CM | POA: Diagnosis not present

## 2024-03-09 DIAGNOSIS — L821 Other seborrheic keratosis: Secondary | ICD-10-CM | POA: Diagnosis not present

## 2024-03-16 ENCOUNTER — Ambulatory Visit: Admitting: Primary Care

## 2024-03-31 ENCOUNTER — Ambulatory Visit: Admitting: Primary Care

## 2024-03-31 ENCOUNTER — Encounter: Payer: Self-pay | Admitting: Primary Care

## 2024-03-31 VITALS — BP 128/72 | HR 73 | Temp 97.1°F | Ht 62.0 in | Wt 134.0 lb

## 2024-03-31 DIAGNOSIS — E785 Hyperlipidemia, unspecified: Secondary | ICD-10-CM

## 2024-03-31 DIAGNOSIS — Z23 Encounter for immunization: Secondary | ICD-10-CM

## 2024-03-31 DIAGNOSIS — H409 Unspecified glaucoma: Secondary | ICD-10-CM | POA: Diagnosis not present

## 2024-03-31 DIAGNOSIS — M810 Age-related osteoporosis without current pathological fracture: Secondary | ICD-10-CM

## 2024-03-31 DIAGNOSIS — R002 Palpitations: Secondary | ICD-10-CM

## 2024-03-31 DIAGNOSIS — Z1231 Encounter for screening mammogram for malignant neoplasm of breast: Secondary | ICD-10-CM | POA: Diagnosis not present

## 2024-03-31 DIAGNOSIS — M159 Polyosteoarthritis, unspecified: Secondary | ICD-10-CM

## 2024-03-31 LAB — LIPID PANEL
Cholesterol: 159 mg/dL (ref 0–200)
HDL: 74.2 mg/dL (ref >39.00)
LDL Cholesterol: 60 mg/dL (ref 0–99)
NonHDL: 84.83
Total CHOL/HDL Ratio: 2
Triglycerides: 125 mg/dL (ref 0.0–149.0)
VLDL: 25 mg/dL (ref 0.0–40.0)

## 2024-03-31 NOTE — Assessment & Plan Note (Signed)
 Controlled.  Following with cardiology, office notes reviewed from March 2025. Continue metoprolol  succinate 25 mg daily.

## 2024-03-31 NOTE — Assessment & Plan Note (Signed)
 Repeat lipid panel pending. Continue rosuvastatin 5 mg daily.

## 2024-03-31 NOTE — Progress Notes (Signed)
 Subjective:    Patient ID: Ann Estes, female    DOB: 1941/04/24, 83 y.o.   MRN: [id number]  HPI  Ann Estes is a very pleasant 83 y.o. female with a history of osteoporosis, osteoarthritis, hyperlipidemia, palpitations who presents today for follow-up of chronic conditions.  1) Osteoporosis: Currently managed on ibandronate  150 mg once monthly, calcium , vitamin D .  Last bone density scan was completed in September 2024, slight improvement to right forearm radius.   2) Hyperlipidemia/Palpitations: Currently managed on rosuvastatin  5 mg daily for hyperlipidemia and metoprolol  succinate 25 mg daily for palpitations.  She denies chest pain, palpitations. Her dyspnea has resolved.  Evaluated by cardiology in March 2025.  No changes were made to her regimen.  3) Osteoarthritis: Previously managed on meloxicam  7.5 mg daily. She stopped taking this >1 year ago as she's done well on OTC supplements. She denies concerns for arthritis today. She is active and feeling well.   BP Readings from Last 3 Encounters:  03/31/24 128/72  02/10/24 118/60  03/18/23 136/78      Review of Systems  Eyes:  Negative for visual disturbance.  Respiratory:  Negative for shortness of breath.   Cardiovascular:  Negative for chest pain and palpitations.  Gastrointestinal:  Negative for constipation and diarrhea.  Musculoskeletal:  Negative for arthralgias.  Neurological:  Negative for headaches.  Psychiatric/Behavioral:  The patient is not nervous/anxious.          Past Medical History:  Diagnosis Date   Acute cholecystitis due to biliary calculus 03/10/2020   BPPV (benign paroxysmal positional vertigo)    Chicken pox    Glaucoma    Hyperlipidemia    Osteoarthritis    Osteoporosis 03/18/2023   Palpitations 10/13/2018    Social History   Socioeconomic History   Marital status: Divorced    Spouse name: Not on file   Number of children: Not on file   Years of education: Not on file   Highest  education level: Bachelor's degree (e.g., BA, AB, BS)  Occupational History   Occupation: retired  Tobacco Use   Smoking status: Former    Current packs/day: 0.00    Average packs/day: 1.5 packs/day for 20.0 years (30.0 ttl pk-yrs)    Types: Cigarettes    Start date: 02/09/1979    Quit date: 02/09/1999    Years since quitting: 25.1   Smokeless tobacco: Never  Vaping Use   Vaping status: Never Used  Substance and Sexual Activity   Alcohol use: Yes    Comment: twice a year   Drug use: No   Sexual activity: Not Currently  Other Topics Concern   Not on file  Social History Narrative   Not on file   Social Drivers of Health   Financial Resource Strain: Low Risk  (03/12/2024)   Overall Financial Resource Strain (CARDIA)    Difficulty of Paying Living Expenses: Not hard at all  Food Insecurity: No Food Insecurity (03/12/2024)   Hunger Vital Sign    Worried About Running Out of Food in the Last Year: Never true    Ran Out of Food in the Last Year: Never true  Transportation Needs: No Transportation Needs (03/12/2024)   PRAPARE - Administrator, Civil Service (Medical): No    Lack of Transportation (Non-Medical): No  Physical Activity: Insufficiently Active (03/12/2024)   Exercise Vital Sign    Days of Exercise per Week: 7 days    Minutes of Exercise per Session: 20 min  Stress: No  Stress Concern Present (03/12/2024)   [employer] of Occupational Health - Occupational Stress Questionnaire    Feeling of Stress : Not at all  Social Connections: Socially Isolated (03/12/2024)   Social Connection and Isolation Panel [NHANES]    Frequency of Communication with Friends and Family: More than three times a week    Frequency of Social Gatherings with Friends and Family: Once a week    Attends Religious Services: Never    Database administrator or Organizations: No    Attends Banker Meetings: Never    Marital Status: Divorced  Catering manager Violence: Not At  Risk (07/01/2023)   Humiliation, Afraid, Rape, and Kick questionnaire    Fear of Current or Ex-Partner: No    Emotionally Abused: No    Physically Abused: No    Sexually Abused: No    Past Surgical History:  Procedure Laterality Date   AUGMENTATION MAMMAPLASTY Bilateral ?   silicone implants now removed    BREAST IMPLANT REMOVAL     CATARACT EXTRACTION Bilateral    CHOLECYSTECTOMY N/A 03/11/2020   Procedure: LAPAROSCOPIC CHOLECYSTECTOMY;  Surgeon: Adalberto Acton, MD;  Location: [facility] OR;  Service: General;  Laterality: N/A;   COSMETIC SURGERY     Eyelids, tummy tuck   JOINT REPLACEMENT  both hips   you have this info   PLACEMENT OF BREAST IMPLANTS     TOTAL HIP ARTHROPLASTY Right 07/2004   TOTAL HIP ARTHROPLASTY Left 02/2005   TRIGGER FINGER RELEASE  11/2016   Thumb    Family History  Problem Relation Age of Onset   Heart disease Mother    Cancer Father    Birth defects Daughter    Arthritis Son    Cancer Maternal Grandmother    Heart attack Maternal Grandfather    Asthma Sister    Depression Sister    Asthma Sister     No Known Allergies  Current Outpatient Medications on File Prior to Visit  Medication Sig Dispense Refill   Acetaminophen  (TYLENOL  8 HOUR PO) Take by mouth.     b complex vitamins capsule Take by mouth.     Calcium  Carbonate (CALCIUM  600 PO) Take 2 tablets by mouth daily at 6 (six) AM.     cholecalciferol (VITAMIN D3) 25 MCG (1000 UNIT) tablet Take 1,000 Units by mouth daily.     Green Tea, Camillia sinensis, (GREEN TEA PO) Take by mouth.      ibandronate  (BONIVA ) 150 MG tablet TAKE 1 TABLET (150 MG TOTAL) BY MOUTH EVERY 30 (THIRTY) DAYS. TAKE IN THE MORNING WITH A FULL GLASS OF WATER, ON AN EMPTY STOMACH, AND DO NOT TAKE ANYTHING ELSE BY MOUTH FOR 30 MIN. AVOID LAYING FLAT FOR 2 HOURS. 3 tablet 0   latanoprost  (XALATAN ) 0.005 % ophthalmic solution Place 1 drop into both eyes at bedtime.   2   metoprolol  succinate (TOPROL -XL) 25 MG 24 hr tablet TAKE 1  TABLET (25 MG TOTAL) BY MOUTH DAILY. FOR PALPITATIONS 90 tablet 3   Multiple Vitamins-Minerals (ONE DAILY WOMENS 50 PLUS) TABS      NAPROXEN PO Take by mouth.     rosuvastatin  (CRESTOR ) 5 MG tablet TAKE 1 TABLET (5 MG TOTAL) BY MOUTH EVERY EVENING. FOR CHOLESTEROL. 90 tablet 0   No current facility-administered medications on file prior to visit.    BP 128/72   Pulse 73   Temp (!) 97.1 F (36.2 C) (Temporal)   Ht 5\' 2"  (1.575 m)   Wt 134  lb (60.8 kg)   SpO2 100%   BMI 24.51 kg/m  Objective:   Physical Exam Cardiovascular:     Rate and Rhythm: Normal rate and regular rhythm.  Pulmonary:     Effort: Pulmonary effort is normal.     Breath sounds: Normal breath sounds.  Abdominal:     General: Bowel sounds are normal.     Palpations: Abdomen is soft.     Tenderness: There is no abdominal tenderness.  Musculoskeletal:     Cervical back: Neck supple.  Skin:    General: Skin is warm and dry.  Neurological:     Mental Status: She is alert and oriented to person, place, and time.  Psychiatric:        Mood and Affect: Mood normal.           Assessment & Plan:  Hyperlipidemia, unspecified hyperlipidemia type Assessment & Plan: Repeat lipid panel pending.  Continue rosuvastatin  5 mg daily.   Orders: -     Lipid panel  Osteoporosis without current pathological fracture, unspecified osteoporosis type Assessment & Plan: Bone density scan UTD.  Continue ibandronate  150 mg monthly, vitamin D , calcium .  Repeat bone density scan in 2026.   Glaucoma, unspecified glaucoma type, unspecified laterality Assessment & Plan: Following with ophthalmology.  Contiue Xalatan  0.005% drops.    Osteoarthritis of multiple joints, unspecified osteoarthritis type Assessment & Plan: Stable. No concerns today.  Continue to monitor.    Palpitations Assessment & Plan: Controlled.  Following with cardiology, office notes reviewed from March 2025. Continue metoprolol  succinate  25 mg daily.   Screening mammogram for breast cancer -     3D Screening Mammogram, Left and Right; Future        Gabriel John, NP

## 2024-03-31 NOTE — Addendum Note (Signed)
 Addended by: Jamille Fisher K on: 03/31/2024 11:38 AM   Modules accepted: Level of Service

## 2024-03-31 NOTE — Assessment & Plan Note (Signed)
 Stable.  No concerns today. Continue to monitor.

## 2024-03-31 NOTE — Assessment & Plan Note (Signed)
 Bone density scan UTD.  Continue ibandronate  150 mg monthly, vitamin D , calcium .  Repeat bone density scan in 2026.

## 2024-03-31 NOTE — Patient Instructions (Signed)
 Stop by the lab prior to leaving today. I will notify you of your results once received.   [facility] to schedule [facility].   It was a pleasure to see you today!

## 2024-03-31 NOTE — Assessment & Plan Note (Signed)
 Following with ophthalmology.  Contiue Xalatan  0.005% drops.

## 2024-04-04 ENCOUNTER — Other Ambulatory Visit: Payer: Self-pay | Admitting: Primary Care

## 2024-04-04 DIAGNOSIS — R002 Palpitations: Secondary | ICD-10-CM

## 2024-05-07 ENCOUNTER — Other Ambulatory Visit: Payer: Self-pay | Admitting: Primary Care

## 2024-05-07 DIAGNOSIS — M810 Age-related osteoporosis without current pathological fracture: Secondary | ICD-10-CM

## 2024-05-28 ENCOUNTER — Other Ambulatory Visit: Payer: Self-pay | Admitting: Primary Care

## 2024-05-28 DIAGNOSIS — E785 Hyperlipidemia, unspecified: Secondary | ICD-10-CM

## 2024-07-01 ENCOUNTER — Ambulatory Visit
Admission: RE | Admit: 2024-07-01 | Discharge: 2024-07-01 | Disposition: A | Discharge status: Home / Self Care | Source: Ambulatory Visit | Attending: Primary Care | Admitting: Primary Care

## 2024-07-01 DIAGNOSIS — Z1231 Encounter for screening mammogram for malignant neoplasm of breast: Secondary | ICD-10-CM | POA: Diagnosis not present

## 2024-07-05 ENCOUNTER — Ambulatory Visit: Payer: Self-pay | Admitting: Primary Care

## 2024-07-05 ENCOUNTER — Ambulatory Visit: Payer: Medicare Other

## 2024-07-05 VITALS — BP 128/72 | Ht 62.0 in | Wt 133.0 lb

## 2024-07-05 DIAGNOSIS — Z2821 Immunization not carried out because of patient refusal: Secondary | ICD-10-CM

## 2024-07-05 DIAGNOSIS — Z0000 Encounter for general adult medical examination without abnormal findings: Secondary | ICD-10-CM

## 2024-07-05 NOTE — Progress Notes (Signed)
 Because this visit was a virtual/telehealth visit,  certain criteria was not obtained, such a blood pressure, CBG if applicable, and timed get up and go. Any medications not marked as taking were not mentioned during the medication reconciliation part of the visit. Any vitals not documented were not able to be obtained due to this being a telehealth visit or patient was unable to self-report a recent blood pressure reading due to a lack of equipment at home via telehealth. Vitals that have been documented are verbally provided by the patient.  This visit was performed by a medical professional under my direct supervision. I was immediately available for consultation/collaboration. I have reviewed and agree with the Annual Wellness Visit documentation.  Subjective:   Ann Estes is a 83 y.o. who presents for a Medicare Wellness preventive visit.  As a reminder, Annual Wellness Visits don't include a physical exam, and some assessments may be limited, especially if this visit is performed virtually. We may recommend an in-person follow-up visit with your provider if needed.  Visit Complete: Virtual I connected with  Ann Estes on 07/05/24 by a audio enabled telemedicine application and verified that I am speaking with the correct person using two identifiers.  Patient Location: Home  Provider Location: Home Office  I discussed the limitations of evaluation and management by telemedicine. The patient expressed understanding and agreed to proceed.  Vital Signs: Because this visit was a virtual/telehealth visit, some criteria may be missing or patient reported. Any vitals not documented were not able to be obtained and vitals that have been documented are patient reported.  VideoDeclined- This patient declined Librarian, academic. Therefore the visit was completed with audio only.  Persons Participating in Visit: Patient.  AWV Questionnaire: Yes: Patient Medicare AWV  questionnaire was completed by the patient on 07/02/2024; I have confirmed that all information answered by patient is correct and no changes since this date.  Cardiac Risk Factors include: advanced age (>65men, >84 women);dyslipidemia     Objective:    Today's Vitals   07/05/24 1316  BP: 128/72  Weight: 133 lb (60.3 kg)  Height: 5' 2 (1.575 m)   Body mass index is 24.33 kg/m.     07/05/2024    1:15 PM 07/01/2023   10:57 AM 03/28/2022    3:55 PM 03/02/2021    1:21 PM 02/29/2020    2:53 PM 02/04/2018   12:14 PM  Advanced Directives  Does Patient Have a Medical Advance Directive? Yes Yes Yes Yes Yes Yes   Type of Estate agent of [place];Living will Healthcare Power of [place];Living will Healthcare Power of [place];Living will Healthcare Power of [place];Living will Healthcare Power of [place];Living will Healthcare Power of Attorney  Does patient want to make changes to medical advance directive? No - Patient declined No - Patient declined      Copy of Healthcare Power of Attorney in Chart? Yes - validated most recent copy scanned in chart (See row information) Yes - validated most recent copy scanned in chart (See row information) No - copy requested No - copy requested No - copy requested No - copy requested      Data saved with a previous flowsheet row definition    Current Medications (verified) Outpatient Encounter Medications as of 07/05/2024  Medication Sig   b complex vitamins capsule Take by mouth.   Calcium  Carbonate (CALCIUM  600 PO) Take 2 tablets by mouth daily at 6 (six) AM.   cholecalciferol (VITAMIN D3) 25 MCG (  1000 UNIT) tablet Take 1,000 Units by mouth daily.   Green Tea, Camillia sinensis, (GREEN TEA PO) Take by mouth.    latanoprost  (XALATAN ) 0.005 % ophthalmic solution Place 1 drop into both eyes at bedtime.    metoprolol  succinate (TOPROL -XL) 25 MG 24 hr tablet TAKE 1 TABLET (25 MG TOTAL) BY MOUTH DAILY. FOR PALPITATIONS   Multiple  Vitamins-Minerals (ONE DAILY WOMENS 50 PLUS) TABS    NAPROXEN PO Take by mouth.   Acetaminophen  (TYLENOL  8 HOUR PO) Take by mouth.   ibandronate  (BONIVA ) 150 MG tablet TAKE 1 TABLET (150 MG TOTAL) BY MOUTH EVERY 30 (THIRTY) DAYS. TAKE IN THE MORNING WITH A FULL GLASS OF WATER, ON AN EMPTY STOMACH, AND DO NOT TAKE ANYTHING ELSE BY MOUTH FOR 30 MIN. AVOID LAYING FLAT FOR 2 HOURS.   rosuvastatin  (CRESTOR ) 5 MG tablet TAKE 1 TABLET (5 MG TOTAL) BY MOUTH EVERY EVENING. FOR CHOLESTEROL.   No facility-administered encounter medications on file as of 07/05/2024.    Allergies (verified) Patient has no known allergies.   History: Past Medical History:  Diagnosis Date   Acute cholecystitis due to biliary calculus 03/10/2020   BPPV (benign paroxysmal positional vertigo)    Chicken pox    Glaucoma    Hyperlipidemia    Osteoarthritis    Osteoporosis 03/18/2023   Palpitations 10/13/2018   Past Surgical History:  Procedure Laterality Date   AUGMENTATION MAMMAPLASTY Bilateral ?   silicone implants now removed    BREAST IMPLANT REMOVAL     CATARACT EXTRACTION Bilateral    CHOLECYSTECTOMY N/A 03/11/2020   Procedure: LAPAROSCOPIC CHOLECYSTECTOMY;  Surgeon: Signe Mitzie LABOR, MD;  Location: [facility] OR;  Service: General;  Laterality: N/A;   COSMETIC SURGERY     Eyelids, tummy tuck   JOINT REPLACEMENT  both hips   you have this info   PLACEMENT OF BREAST IMPLANTS     TOTAL HIP ARTHROPLASTY Estes 07/2004   TOTAL HIP ARTHROPLASTY Left 02/2005   TRIGGER FINGER RELEASE  11/2016   Thumb   Family History  Problem Relation Age of Onset   Heart disease Mother    Cancer Father    Birth defects Daughter    Arthritis Son    Cancer Maternal Grandmother    Heart attack Maternal Grandfather    Asthma Sister    Depression Sister    Asthma Sister    Social History   Socioeconomic History   Marital status: Divorced    Spouse name: Not on file   Number of children: Not on file   Years of education: Not  on file   Highest education level: Bachelor's degree (e.g., BA, AB, BS)  Occupational History   Occupation: retired  Tobacco Use   Smoking status: Former    Current packs/day: 0.00    Average packs/day: 1.5 packs/day for 20.0 years (30.0 ttl pk-yrs)    Types: Cigarettes    Start date: 02/09/1979    Quit date: 02/09/1999    Years since quitting: 25.4   Smokeless tobacco: Never  Vaping Use   Vaping status: Never Used  Substance and Sexual Activity   Alcohol use: Yes    Comment: twice a year   Drug use: No   Sexual activity: Not Currently  Other Topics Concern   Not on file  Social History Narrative   Not on file   Social Drivers of Health   Financial Resource Strain: Low Risk  (07/02/2024)   Overall Financial Resource Strain (CARDIA)    Difficulty  of Paying Living Expenses: Not hard at all  Food Insecurity: No Food Insecurity (07/02/2024)   Hunger Vital Sign    Worried About Running Out of Food in the Last Year: Never true    Ran Out of Food in the Last Year: Never true  Transportation Needs: No Transportation Needs (07/02/2024)   PRAPARE - Administrator, Civil Service (Medical): No    Lack of Transportation (Non-Medical): No  Physical Activity: Insufficiently Active (07/02/2024)   Exercise Vital Sign    Days of Exercise per Week: 7 days    Minutes of Exercise per Session: 20 min  Stress: No Stress Concern Present (07/02/2024)   [employer] of Occupational Health - Occupational Stress Questionnaire    Feeling of Stress: Not at all  Social Connections: Socially Isolated (07/02/2024)   Social Connection and Isolation Panel    Frequency of Communication with Friends and Family: More than three times a week    Frequency of Social Gatherings with Friends and Family: More than three times a week    Attends Religious Services: Never    Database administrator or Organizations: No    Attends Engineer, structural: Never    Marital Status: Divorced    Tobacco  Counseling Counseling given: Not Answered    Clinical Intake:  Pre-visit preparation completed: Yes  Pain : No/denies pain     BMI - recorded: 24.33 Nutritional Status: BMI of 19-24  Normal Nutritional Risks: None Diabetes: No  No results found for: HGBA1C   How often do you need to have someone help you when you read instructions, pamphlets, or other written materials from your doctor or pharmacy?: 1 - Never  Interpreter Needed?: No  Information entered by :: [employer]   Activities of Daily Living     07/02/2024    2:36 PM  In your present state of health, do you have any difficulty performing the following activities:  Hearing? 0  Vision? 0  Difficulty concentrating or making decisions? 0  Walking or climbing stairs? 0  Dressing or bathing? 0  Doing errands, shopping? 0  Preparing Food and eating ? N  Using the Toilet? N  In the past six months, have you accidently leaked urine? N  Do you have problems with loss of bowel control? N  Managing your Medications? N  Managing your Finances? N  Housekeeping or managing your Housekeeping? N    Patient Care Team: Gretta Comer POUR, NP as PCP - General (Internal Medicine) Claudene Victory ORN, MD (Inactive) as PCP - Cardiology (Cardiology) Mittie Gaskin, MD as Referring Physician (Ophthalmology)  I have updated your Care Teams any recent Medical Services you may have received from other providers in the past year.     Assessment:   This is a routine wellness examination for Ann Estes.  Hearing/Vision screen Hearing Screening - Comments:: Some difficulties  Vision Screening - Comments:: Patient has glasses    Goals Addressed             This Visit's Progress    Patient Stated   On track    03/28/2022 - Maintain independence       Depression Screen     07/05/2024    1:20 PM 03/31/2024   11:21 AM 07/01/2023   10:56 AM 03/18/2023   11:22 AM 03/28/2022    3:56 PM 03/12/2022   10:28 AM 03/02/2021     1:22 PM  PHQ 2/9 Scores  PHQ - 2 Score 0  0 0 0 1 1 0  PHQ- 9 Score 1    3 3  0    Fall Risk     07/02/2024    2:36 PM 03/31/2024   11:21 AM 06/30/2023    2:59 PM 03/18/2023   11:22 AM 03/12/2022   10:29 AM  Fall Risk   Falls in the past year? 0 0 0 0 0  Number falls in past yr: 0 0 0 0 0  Injury with Fall? 0 0 0 0 0  Risk for fall due to : No Fall Risks No Fall Risks No Fall Risks No Fall Risks   Follow up Falls evaluation completed Falls evaluation completed Falls evaluation completed;Falls prevention discussed Falls evaluation completed     MEDICARE RISK AT HOME:  Medicare Risk at Home Any stairs in or around the home?: (Patient-Rptd) Yes If so, are there any without handrails?: (Patient-Rptd) No Home free of loose throw rugs in walkways, pet beds, electrical cords, etc?: (Patient-Rptd) No Adequate lighting in your home to reduce risk of falls?: (Patient-Rptd) Yes Life alert?: (Patient-Rptd) No Use of a cane, walker or w/c?: (Patient-Rptd) No Grab bars in the bathroom?: (Patient-Rptd) No Shower chair or bench in shower?: (Patient-Rptd) Yes Elevated toilet seat or a handicapped toilet?: (Patient-Rptd) Yes  TIMED UP AND GO:  Was the test performed?  No  Cognitive Function: 6CIT completed    03/02/2021    1:28 PM 02/29/2020    2:59 PM 02/04/2018   11:59 AM  MMSE - Mini Mental State Exam  Orientation to time 5 5 5   Orientation to Place 5 5 5   Registration 3 3 3   Attention/ Calculation 5 5 0  Recall 3 3 3   Language- name 2 objects   0  Language- repeat 1 1 1   Language- follow 3 step command   3  Language- read & follow direction   0  Write a sentence   0  Copy design   0  Total score   20        07/05/2024    1:20 PM 07/01/2023   10:59 AM 03/28/2022    3:24 PM  6CIT Screen  What Year? 0 points 0 points 0 points  What month? 0 points 0 points 0 points  What time? 0 points 0 points 0 points  Count back from 20 0 points 0 points 0 points  Months in reverse 0 points 0 points  0 points  Repeat phrase 0 points 0 points 0 points  Total Score 0 points 0 points 0 points    Immunizations Immunization History  Administered Date(s) Administered   Fluad Quad(high Dose 65+) 08/06/2019, 09/15/2020, 09/13/2021   Influenza, High Dose Seasonal PF 10/05/2018   Influenza,inj,Quad PF,6+ Mos 08/11/2017   PFIZER Comirnaty(Gray Top)Covid-19 Tri-Sucrose Vaccine 02/21/2021   PFIZER(Purple Top)SARS-COV-2 Vaccination 12/04/2019, 12/25/2019, 08/25/2020   PNEUMOCOCCAL CONJUGATE-20 03/31/2024   Pfizer Covid-19 Vaccine Bivalent Booster 27yrs & up 08/08/2021, 04/03/2022   Pneumococcal Conjugate-13 11/13/2013   Pneumococcal Polysaccharide-23 12/22/2014   Td 07/23/2010   Tdap 03/07/2020   Zoster Recombinant(Shingrix) 05/19/2018, 09/26/2018   Zoster, Live 06/27/2009    Screening Tests Health Maintenance  Topic Date Due   COVID-19 Vaccine (7 - 2024-25 season) 07/27/2023   INFLUENZA VACCINE  06/25/2024   Medicare Annual Wellness (AWV)  07/05/2025   DEXA SCAN  08/05/2025   MAMMOGRAM  07/01/2026   DTaP/Tdap/Td (3 - Td or Tdap) 03/07/2030   Pneumococcal Vaccine: 50+ Years  Completed   Zoster Vaccines-  Shingrix  Completed   Hepatitis B Vaccines  Aged Out   HPV VACCINES  Aged Out   Meningococcal B Vaccine  Aged Out   Hepatitis C Screening  Discontinued    Health Maintenance  Health Maintenance Due  Topic Date Due   COVID-19 Vaccine (7 - 2024-25 season) 07/27/2023   INFLUENZA VACCINE  06/25/2024   Health Maintenance Items Addressed:patient declined   Additional Screening:  Vision Screening: Recommended annual ophthalmology exams for early detection of glaucoma and other disorders of the eye. Would you like a referral to an eye doctor? No    Dental Screening: Recommended annual dental exams for proper oral hygiene  Community Resource Referral / Chronic Care Management: CRR required this visit?  No   CCM required this visit?  No   Plan:    I have personally  reviewed and noted the following in the patient's chart:   Medical and social history Use of alcohol, tobacco or illicit drugs  Current medications and supplements including opioid prescriptions. Patient is not currently taking opioid prescriptions. Functional ability and status Nutritional status Physical activity Advanced directives List of other physicians Hospitalizations, surgeries, and ER visits in previous 12 months Vitals Screenings to include cognitive, depression, and falls Referrals and appointments  In addition, I have reviewed and discussed with patient certain preventive protocols, quality metrics, and best practice recommendations. A written personalized care plan for preventive services as well as general preventive health recommendations were provided to patient.   Ann Estes, [place]   07/05/2024   After Visit Summary: (MyChart) Due to this being a telephonic visit, the after visit summary with patients personalized plan was offered to patient via MyChart   Notes: Nothing significant to report at this time.

## 2024-07-05 NOTE — Patient Instructions (Signed)
 Ms. Dillehay , Thank you for taking time out of your busy schedule to complete your Annual Wellness Visit with me. I enjoyed our conversation and look forward to speaking with you again next year. I, as well as your care team,  appreciate your ongoing commitment to your health goals. Please review the following plan we discussed and let me know if I can assist you in the future. Your Game plan/ To Do List    Referrals: If you haven't heard from the office you've been referred to, please reach out to them at the phone provided.   Follow up Visits: We will see or speak with you next year for your Next Medicare AWV with our clinical staff Have you seen your provider in the last 6 months (3 months if uncontrolled diabetes)? No  Clinician Recommendations:  Aim for 30 minutes of exercise or brisk walking, 6-8 glasses of water, and 5 servings of fruits and vegetables each day.       This is a list of the screenings recommended for you:  Health Maintenance  Topic Date Due   COVID-19 Vaccine (7 - 2024-25 season) 07/27/2023   Flu Shot  06/25/2024   Medicare Annual Wellness Visit  07/05/2025   DEXA scan (bone density measurement)  08/05/2025   Mammogram  07/01/2026   DTaP/Tdap/Td vaccine (3 - Td or Tdap) 03/07/2030   Pneumococcal Vaccine for age over 57  Completed   Zoster (Shingles) Vaccine  Completed   Hepatitis B Vaccine  Aged Out   HPV Vaccine  Aged Out   Meningitis B Vaccine  Aged Out   Hepatitis C Screening  Discontinued    Advanced directives: (In Chart) A copy of your advanced directives are scanned into your chart should your provider ever need it. Advance Care Planning is important because it:  [x]  Makes sure you receive the medical care that is consistent with your values, goals, and preferences  [x]  It provides guidance to your family and loved ones and reduces their decisional burden about whether or not they are making the right decisions based on your wishes.  Follow the link  provided in your after visit summary or read over the paperwork we have mailed to you to help you started getting your Advance Directives in place. If you need assistance in completing these, please reach out to us  so that we can help you!  See attachments for Preventive Care and Fall Prevention Tips.

## 2024-08-26 DIAGNOSIS — H401131 Primary open-angle glaucoma, bilateral, mild stage: Secondary | ICD-10-CM | POA: Diagnosis not present

## 2024-09-02 DIAGNOSIS — H04123 Dry eye syndrome of bilateral lacrimal glands: Secondary | ICD-10-CM | POA: Diagnosis not present

## 2024-09-02 DIAGNOSIS — H43813 Vitreous degeneration, bilateral: Secondary | ICD-10-CM | POA: Diagnosis not present

## 2024-09-02 DIAGNOSIS — Z961 Presence of intraocular lens: Secondary | ICD-10-CM | POA: Diagnosis not present

## 2024-09-02 DIAGNOSIS — H401131 Primary open-angle glaucoma, bilateral, mild stage: Secondary | ICD-10-CM | POA: Diagnosis not present

## 2024-09-06 DIAGNOSIS — Z23 Encounter for immunization: Secondary | ICD-10-CM | POA: Diagnosis not present

## 2024-10-13 DIAGNOSIS — Z23 Encounter for immunization: Secondary | ICD-10-CM | POA: Diagnosis not present

## 2024-11-03 DIAGNOSIS — M65332 Trigger finger, left middle finger: Secondary | ICD-10-CM | POA: Diagnosis not present

## 2024-12-27 ENCOUNTER — Ambulatory Visit: Payer: Self-pay

## 2024-12-27 NOTE — Telephone Encounter (Signed)
 FYI Only or Action Required?: FYI only for provider: Home care advised.  Patient was last seen in primary care on 03/31/2024 by Gretta Comer POUR, NP.  Called Nurse Triage reporting Fall.  Symptoms began several days ago.  Interventions attempted: Nothing.  Symptoms are: unchanged.  Triage Disposition: Home Care  Patient/caregiver understands and will follow disposition?:   Reason for Disposition  Small bruise is present  Answer Assessment - Initial Assessment Questions Pt calling to report a fall on 12/25/24. Pt slipped in the house and fell to her buttocks - hit left elbow and caught herself with left hand. Pt states she has a big bruise above the elbow and some swelling in the left hand, brownish blue area around wrist going to the thumb, turning wrist is uncomfortable. Able to move left arm and hand. Denies pain but states it hurts or is uncomfortable to push things down. Pt wondering if she has a hairline fracture.   Pt advised home care based on protocol and advised UC if she wanted to rule out possible fractures.   1. MECHANISM: How did the fall happen?     Slipped on wet floor 2. DOMESTIC VIOLENCE AND ELDER ABUSE SCREENING: Did you fall because someone pushed you or tried to hurt you? If Yes, ask: Are you safe now?     No  3. ONSET: When did the fall happen? (e.g., minutes, hours, or days ago)     12/25/24 4. LOCATION: What part of the body hit the ground? (e.g., back, buttocks, head, hips, knees, hands, head, stomach)     buttocks 5. INJURY: Did you hurt (injure) yourself when you fell? If Yes, ask: What did you injure? Tell me more about this? (e.g., body area; type of injury; pain severity)     Left elbow and left wrist 6. PAIN: Is there any pain? If Yes, ask: How bad is the pain? (e.g., Scale 0-10; or none, mild,      0/10 7. SIZE: For cuts, bruises, or swelling, ask: How large is it? (e.g., inches or centimeters)      Bruises - above left elbow,  around left wrist  9. OTHER SYMPTOMS: Do you have any other symptoms? (e.g., dizziness, fever, weakness; new-onset or worsening).      Denies  10. CAUSE: What do you think caused the fall (or falling)? (e.g., dizzy spell, tripped)       Slipped on wet floor  Protocols used: Falls and [facility]  Reason for Triage: Fall this weekend, left arm and elbow pain, swollen.

## 2025-03-01 ENCOUNTER — Ambulatory Visit: Admitting: Primary Care

## 2025-07-06 ENCOUNTER — Ambulatory Visit
